# Patient Record
Sex: Female | Born: 1980 | Race: White | Hispanic: No | Marital: Married | Smoking: Never smoker
Health system: Southern US, Community
[De-identification: ages and names within clinical notes are randomized; demographics above are authoritative.]

## PROBLEM LIST (undated history)

## (undated) ENCOUNTER — Inpatient Hospital Stay (HOSPITAL_COMMUNITY): Payer: Self-pay

## (undated) DIAGNOSIS — R87629 Unspecified abnormal cytological findings in specimens from vagina: Secondary | ICD-10-CM

## (undated) DIAGNOSIS — C801 Malignant (primary) neoplasm, unspecified: Secondary | ICD-10-CM

## (undated) DIAGNOSIS — K219 Gastro-esophageal reflux disease without esophagitis: Secondary | ICD-10-CM

## (undated) HISTORY — DX: Malignant (primary) neoplasm, unspecified: C80.1

## (undated) HISTORY — DX: Gastro-esophageal reflux disease without esophagitis: K21.9

## (undated) HISTORY — DX: Unspecified abnormal cytological findings in specimens from vagina: R87.629

---
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
## 2005-12-20 HISTORY — PX: NASAL SEPTUM SURGERY: SHX37

## 2014-12-20 DIAGNOSIS — C439 Malignant melanoma of skin, unspecified: Secondary | ICD-10-CM | POA: Insufficient documentation

## 2015-07-21 HISTORY — PX: LEG SURGERY: SHX1003

## 2015-12-02 ENCOUNTER — Other Ambulatory Visit (INDEPENDENT_AMBULATORY_CARE_PROVIDER_SITE_OTHER): Payer: 59

## 2015-12-02 ENCOUNTER — Ambulatory Visit (INDEPENDENT_AMBULATORY_CARE_PROVIDER_SITE_OTHER): Payer: 59 | Admitting: Internal Medicine

## 2015-12-02 ENCOUNTER — Encounter: Payer: Self-pay | Admitting: Internal Medicine

## 2015-12-02 VITALS — BP 110/70 | HR 56 | Temp 99.0°F | Resp 14 | Ht 65.0 in | Wt 128.1 lb

## 2015-12-02 DIAGNOSIS — Z0000 Encounter for general adult medical examination without abnormal findings: Secondary | ICD-10-CM

## 2015-12-02 DIAGNOSIS — K219 Gastro-esophageal reflux disease without esophagitis: Secondary | ICD-10-CM

## 2015-12-02 DIAGNOSIS — L709 Acne, unspecified: Secondary | ICD-10-CM | POA: Insufficient documentation

## 2015-12-02 DIAGNOSIS — Z85820 Personal history of malignant melanoma of skin: Secondary | ICD-10-CM | POA: Insufficient documentation

## 2015-12-02 DIAGNOSIS — Z9889 Other specified postprocedural states: Secondary | ICD-10-CM

## 2015-12-02 DIAGNOSIS — Z23 Encounter for immunization: Secondary | ICD-10-CM | POA: Diagnosis not present

## 2015-12-02 HISTORY — DX: Acne, unspecified: L70.9

## 2015-12-02 LAB — COMPREHENSIVE METABOLIC PANEL
ALBUMIN: 4.1 g/dL (ref 3.5–5.2)
ALT: 12 U/L (ref 0–35)
AST: 17 U/L (ref 0–37)
Alkaline Phosphatase: 38 U/L — ABNORMAL LOW (ref 39–117)
BUN: 12 mg/dL (ref 6–23)
CALCIUM: 8.5 mg/dL (ref 8.4–10.5)
CHLORIDE: 105 meq/L (ref 96–112)
CO2: 26 meq/L (ref 19–32)
Creatinine, Ser: 0.75 mg/dL (ref 0.40–1.20)
GFR: 94.22 mL/min (ref >60.00)
Glucose, Bld: 101 mg/dL — ABNORMAL HIGH (ref 70–99)
POTASSIUM: 4 meq/L (ref 3.5–5.1)
SODIUM: 138 meq/L (ref 135–145)
Total Bilirubin: 0.5 mg/dL (ref 0.2–1.2)
Total Protein: 6.5 g/dL (ref 6.0–8.3)

## 2015-12-02 LAB — CBC
HEMATOCRIT: 36.3 % (ref 36.0–46.0)
Hemoglobin: 12 g/dL (ref 12.0–15.0)
MCHC: 33 g/dL (ref 30.0–36.0)
MCV: 87.8 fl (ref 78.0–100.0)
Platelets: 216 10*3/uL (ref 150.0–400.0)
RBC: 4.14 Mil/uL (ref 3.87–5.11)
RDW: 12.5 % (ref 11.5–15.5)
WBC: 7.2 10*3/uL (ref 4.0–10.5)

## 2015-12-02 LAB — LIPID PANEL
CHOLESTEROL: 140 mg/dL (ref 0–200)
HDL: 60.6 mg/dL (ref >39.00)
LDL CALC: 73 mg/dL (ref 0–99)
NonHDL: 79.61
TRIGLYCERIDES: 31 mg/dL (ref 0.0–149.0)
Total CHOL/HDL Ratio: 2
VLDL: 6.2 mg/dL (ref 0.0–40.0)

## 2015-12-02 MED ORDER — OMEPRAZOLE 40 MG PO CPDR: 40.0000 mg | DELAYED_RELEASE_CAPSULE | Freq: Every day | ORAL | Status: DC

## 2015-12-02 NOTE — Progress Notes (Signed)
   Subjective:    Patient ID: Suzanne Mclaughlin, female    DOB: March 22, 1981, 34 y.o.   MRN: [id number]  HPI The patient is a new 34 YO female coming in for acid reflux. She has had it in the past and was on omeprazole with good success. She does not have it often. She will only take medicine if she has symptoms several days in a row. She has modified her diet to help with symptoms which helps most of the time. Prior workup with GI in [place].  PMH, [facility], social history reviewed and updated.   Review of Systems  Constitutional: Negative for fever, diaphoresis, activity change, appetite change and fatigue.  HENT: Negative.   Eyes: Negative.   Respiratory: Negative for cough, chest tightness, shortness of breath and wheezing.   Cardiovascular: Negative for chest pain, palpitations and leg swelling.  Gastrointestinal: Negative for nausea, abdominal pain, diarrhea, constipation and abdominal distention.       Mild GERD symptoms  Musculoskeletal: Negative for myalgias, back pain, arthralgias, neck pain and neck stiffness.  Skin: Negative.   Neurological: Negative.   Psychiatric/Behavioral: Negative.       Objective:   Physical Exam  Constitutional: She is oriented to person, place, and time. She appears well-developed and well-nourished.  HENT:  Head: Normocephalic and atraumatic.  Eyes: EOM are normal.  Neck: Normal range of motion.  Cardiovascular: Normal rate and regular rhythm.   Pulmonary/Chest: Effort normal and breath sounds normal. No respiratory distress. She has no wheezes. She has no rales.  Abdominal: Soft. Bowel sounds are normal. She exhibits no distension. There is no tenderness. There is no rebound.  Musculoskeletal: She exhibits no edema.  Neurological: She is alert and oriented to person, place, and time. Coordination normal.  Skin: Skin is warm and dry.  Psychiatric: She has a normal mood and affect.   Filed Vitals:   12/02/15 1457  BP: 110/70  Pulse: 56  Temp: 99 F  (37.2 C)  TempSrc: Oral  Resp: 14  Height: 5\' 5"  (1.651 m)  Weight: 128 lb 1.9 oz (58.115 kg)  SpO2: 99%      Assessment & Plan:  Tdap given at visit.

## 2015-12-02 NOTE — Assessment & Plan Note (Signed)
Rx for omeprazole which she will take prn for symptoms for multiple days in a row.

## 2015-12-02 NOTE — Assessment & Plan Note (Signed)
Currently taking ampicillin for acne and has been on antibiotics for it for several years in a row. Under the care of dermatology.

## 2015-12-02 NOTE — Assessment & Plan Note (Signed)
Sees dermatology every 3 months. Careful with sunscreen and protective clothing.

## 2015-12-02 NOTE — Progress Notes (Signed)
Pre visit review using our clinic review tool, if applicable. No additional management support is needed unless otherwise documented below in the visit note. 

## 2015-12-02 NOTE — Patient Instructions (Signed)
We will check the routine labs today and call you back with the results.   We have given you the tetanus booster shot today. We have sent in the omeprazole.   Good luck and come back in 1-2 years if things are going well. If you have any problems or questions call us sooner.   Health Maintenance, Female Adopting a healthy lifestyle and getting preventive care can go a long way to promote health and wellness. Talk with your health care provider about what schedule of regular examinations is right for you. This is a good chance for you to check in with your provider about disease prevention and staying healthy. In between checkups, there are plenty of things you can do on your own. Experts have done a lot of research about which lifestyle changes and preventive measures are most likely to keep you healthy. Ask your health care provider for more information. WEIGHT AND DIET  Eat a healthy diet  Be sure to include plenty of vegetables, fruits, low-fat dairy products, and lean protein.  Do not eat a lot of foods high in solid fats, added sugars, or salt.  Get regular exercise. This is one of the most important things you can do for your health.  Most adults should exercise for at least 150 minutes each week. The exercise should increase your heart rate and make you sweat (moderate-intensity exercise).  Most adults should also do strengthening exercises at least twice a week. This is in addition to the moderate-intensity exercise.  Maintain a healthy weight  Body mass index (BMI) is a measurement that can be used to identify possible weight problems. It estimates body fat based on height and weight. Your health care provider can help determine your BMI and help you achieve or maintain a healthy weight.  For females 103 years of age and older:   A BMI below 18.5 is considered underweight.  A BMI of 18.5 to 24.9 is normal.  A BMI of 25 to 29.9 is considered overweight.  A BMI of 30 and  above is considered obese.  Watch levels of cholesterol and blood lipids  You should start having your blood tested for lipids and cholesterol at 33 years of age, then have this test every 5 years.  You may need to have your cholesterol levels checked more often if:  Your lipid or cholesterol levels are high.  You are older than 33 years of age.  You are at high risk for heart disease.  CANCER SCREENING   Lung Cancer  Lung cancer screening is recommended for adults 40-15 years old who are at high risk for lung cancer because of a history of smoking.  A yearly low-dose CT scan of the lungs is recommended for people who:  Currently smoke.  Have quit within the past 15 years.  Have at least a 30-pack-year history of smoking. A pack year is smoking an average of one pack of cigarettes a day for 1 year.  Yearly screening should continue until it has been 15 years since you quit.  Yearly screening should stop if you develop a health problem that would prevent you from having lung cancer treatment.  Breast Cancer  Practice breast self-awareness. This means understanding how your breasts normally appear and feel.  It also means doing regular breast self-exams. Let your health care provider know about any changes, no matter how small.  If you are in your 20s or 30s, you should have a clinical breast exam (CBE)  by a health care provider every 1-3 years as part of a regular health exam.  If you are 40 or older, have a CBE every year. Also consider having a breast X-ray (mammogram) every year.  If you have a family history of breast cancer, talk to your health care provider about genetic screening.  If you are at high risk for breast cancer, talk to your health care provider about having an MRI and a mammogram every year.  Breast cancer gene (BRCA) assessment is recommended for women who have family members with BRCA-related cancers. BRCA-related cancers  include:  Breast.  Ovarian.  Tubal.  Peritoneal cancers.  Results of the assessment will determine the need for genetic counseling and BRCA1 and BRCA2 testing. Cervical Cancer Your health care provider may recommend that you be screened regularly for cancer of the pelvic organs (ovaries, uterus, and vagina). This screening involves a pelvic examination, including checking for microscopic changes to the surface of your cervix (Pap test). You may be encouraged to have this screening done every 3 years, beginning at age 21.  For women ages 30-65, health care providers may recommend pelvic exams and Pap testing every 3 years, or they may recommend the Pap and pelvic exam, combined with testing for human papilloma virus (HPV), every 5 years. Some types of HPV increase your risk of cervical cancer. Testing for HPV may also be done on women of any age with unclear Pap test results.  Other health care providers may not recommend any screening for nonpregnant women who are considered low risk for pelvic cancer and who do not have symptoms. Ask your health care provider if a screening pelvic exam is right for you.  If you have had past treatment for cervical cancer or a condition that could lead to cancer, you need Pap tests and screening for cancer for at least 20 years after your treatment. If Pap tests have been discontinued, your risk factors (such as having a new sexual partner) need to be reassessed to determine if screening should resume. Some women have medical problems that increase the chance of getting cervical cancer. In these cases, your health care provider may recommend more frequent screening and Pap tests. Colorectal Cancer  This type of cancer can be detected and often prevented.  Routine colorectal cancer screening usually begins at 33 years of age and continues through 33 years of age.  Your health care provider may recommend screening at an earlier age if you have risk factors for  colon cancer.  Your health care provider may also recommend using home test kits to check for hidden blood in the stool.  A small camera at the end of a tube can be used to examine your colon directly (sigmoidoscopy or colonoscopy). This is done to check for the earliest forms of colorectal cancer.  Routine screening usually begins at age 50.  Direct examination of the colon should be repeated every 5-10 years through 33 years of age. However, you may need to be screened more often if early forms of precancerous polyps or small growths are found. Skin Cancer  Check your skin from head to toe regularly.  Tell your health care provider about any new moles or changes in moles, especially if there is a change in a mole's shape or color.  Also tell your health care provider if you have a mole that is larger than the size of a pencil eraser.  Always use sunscreen. Apply sunscreen liberally and repeatedly throughout the   day.  Protect yourself by wearing long sleeves, pants, a wide-brimmed hat, and sunglasses whenever you are outside. HEART DISEASE, DIABETES, AND HIGH BLOOD PRESSURE   High blood pressure causes heart disease and increases the risk of stroke. High blood pressure is more likely to develop in:  People who have blood pressure in the high end of the normal range (130-139/85-89 mm Hg).  People who are overweight or obese.  People who are African American.  If you are 18-39 years of age, have your blood pressure checked every 3-5 years. If you are 40 years of age or older, have your blood pressure checked every year. You should have your blood pressure measured twice--once when you are at a hospital or clinic, and once when you are not at a hospital or clinic. Record the average of the two measurements. To check your blood pressure when you are not at a hospital or clinic, you can use:  An automated blood pressure machine at a pharmacy.  A home blood pressure monitor.  If you  are between 55 years and 79 years old, ask your health care provider if you should take aspirin to prevent strokes.  Have regular diabetes screenings. This involves taking a blood sample to check your fasting blood sugar level.  If you are at a normal weight and have a low risk for diabetes, have this test once every three years after 33 years of age.  If you are overweight and have a high risk for diabetes, consider being tested at a younger age or more often. PREVENTING INFECTION  Hepatitis B  If you have a higher risk for hepatitis B, you should be screened for this virus. You are considered at high risk for hepatitis B if:  You were born in a country where hepatitis B is common. Ask your health care provider which countries are considered high risk.  Your parents were born in a high-risk country, and you have not been immunized against hepatitis B (hepatitis B vaccine).  You have HIV or AIDS.  You use needles to inject street drugs.  You live with someone who has hepatitis B.  You have had sex with someone who has hepatitis B.  You get hemodialysis treatment.  You take certain medicines for conditions, including cancer, organ transplantation, and autoimmune conditions. Hepatitis C  Blood testing is recommended for:  Everyone born from 1945 through 1965.  Anyone with known risk factors for hepatitis C. Sexually transmitted infections (STIs)  You should be screened for sexually transmitted infections (STIs) including gonorrhea and chlamydia if:  You are sexually active and are younger than 33 years of age.  You are older than 33 years of age and your health care provider tells you that you are at risk for this type of infection.  Your sexual activity has changed since you were last screened and you are at an increased risk for chlamydia or gonorrhea. Ask your health care provider if you are at risk.  If you do not have HIV, but are at risk, it may be recommended that you  take a prescription medicine daily to prevent HIV infection. This is called pre-exposure prophylaxis (PrEP). You are considered at risk if:  You are sexually active and do not regularly use condoms or know the HIV status of your partner(s).  You take drugs by injection.  You are sexually active with a partner who has HIV. Talk with your health care provider about whether you are at high risk of   being infected with HIV. If you choose to begin PrEP, you should first be tested for HIV. You should then be tested every 3 months for as long as you are taking PrEP.  PREGNANCY   If you are premenopausal and you may become pregnant, ask your health care provider about preconception counseling.  If you may become pregnant, take 400 to 800 micrograms (mcg) of folic acid every day.  If you want to prevent pregnancy, talk to your health care provider about birth control (contraception). OSTEOPOROSIS AND MENOPAUSE   Osteoporosis is a disease in which the bones lose minerals and strength with aging. This can result in serious bone fractures. Your risk for osteoporosis can be identified using a bone density scan.  If you are 65 years of age or older, or if you are at risk for osteoporosis and fractures, ask your health care provider if you should be screened.  Ask your health care provider whether you should take a calcium or vitamin D supplement to lower your risk for osteoporosis.  Menopause may have certain physical symptoms and risks.  Hormone replacement therapy may reduce some of these symptoms and risks. Talk to your health care provider about whether hormone replacement therapy is right for you.  HOME CARE INSTRUCTIONS   Schedule regular health, dental, and eye exams.  Stay current with your immunizations.   Do not use any tobacco products including cigarettes, chewing tobacco, or electronic cigarettes.  If you are pregnant, do not drink alcohol.  If you are breastfeeding, limit how  much and how often you drink alcohol.  Limit alcohol intake to no more than 1 drink per day for nonpregnant women. One drink equals 12 ounces of beer, 5 ounces of wine, or 1 ounces of hard liquor.  Do not use street drugs.  Do not share needles.  Ask your health care provider for help if you need support or information about quitting drugs.  Tell your health care provider if you often feel depressed.  Tell your health care provider if you have ever been abused or do not feel safe at home.   This information is not intended to replace advice given to you by your health care provider. Make sure you discuss any questions you have with your health care provider.   Document Released: 06/21/2011 Document Revised: 12/27/2014 Document Reviewed: 11/07/2013 Elsevier Interactive Patient Education 2016 [employer] Inc[employer]  

## 2015-12-21 NOTE — L&D Delivery Note (Signed)
Delivery Note At 12:14 PM Mclaughlin viable and healthy female was delivered via Vaginal, Spontaneous Delivery (Presentation: vtx;  ROA).  APGAR: 9, 9; weight 7 lb 12 oz (3515 g).   Placenta status: spontaneous, intact , .  Cord:  with the following complications: none.  Cord pH: none  Anesthesia:  epidural Episiotomy: None Lacerations: left Labial minora ;Vaginal; left Sulcus Suture Repair: 3.0 chromic Est. Blood Loss (mL): 350  Mom to postpartum.  Baby to Couplet care / Skin to Skin.  Suzanne Mclaughlin 12/04/2016, 3:14 AM

## 2016-05-06 ENCOUNTER — Encounter (HOSPITAL_COMMUNITY): Payer: Self-pay | Admitting: *Deleted

## 2016-05-06 ENCOUNTER — Inpatient Hospital Stay (HOSPITAL_COMMUNITY)
Admission: AD | Admit: 2016-05-06 | Discharge: 2016-05-06 | Disposition: A | Discharge status: Home / Self Care | Payer: Managed Care, Other (non HMO) | Source: Ambulatory Visit | Attending: Obstetrics and Gynecology | Admitting: Obstetrics and Gynecology

## 2016-05-06 DIAGNOSIS — T426X5A Adverse effect of other antiepileptic and sedative-hypnotic drugs, initial encounter: Secondary | ICD-10-CM | POA: Diagnosis not present

## 2016-05-06 DIAGNOSIS — Z85820 Personal history of malignant melanoma of skin: Secondary | ICD-10-CM | POA: Insufficient documentation

## 2016-05-06 DIAGNOSIS — T7840XA Allergy, unspecified, initial encounter: Secondary | ICD-10-CM

## 2016-05-06 DIAGNOSIS — Z3A10 10 weeks gestation of pregnancy: Secondary | ICD-10-CM | POA: Diagnosis not present

## 2016-05-06 DIAGNOSIS — K219 Gastro-esophageal reflux disease without esophagitis: Secondary | ICD-10-CM | POA: Diagnosis not present

## 2016-05-06 DIAGNOSIS — O26891 Other specified pregnancy related conditions, first trimester: Secondary | ICD-10-CM | POA: Diagnosis not present

## 2016-05-06 DIAGNOSIS — O99611 Diseases of the digestive system complicating pregnancy, first trimester: Secondary | ICD-10-CM | POA: Diagnosis not present

## 2016-05-06 DIAGNOSIS — R0602 Shortness of breath: Secondary | ICD-10-CM | POA: Diagnosis present

## 2016-05-06 NOTE — Discharge Instructions (Signed)
Keep hydrated

## 2016-05-06 NOTE — MAU Provider Note (Signed)
History     No chief complaint on file. cc: SOB HPI; 35 yo G1P0 MWF @ [redacted] weeks gestation presented by EMs due to SOB and O2 sat at home of 74% noted ~ 1/2 hr after ingesting phenergan. Pt had been having n/v for past three weeks but it became progressively worse today and called office for medication. Pt had SOB and chest palpitation. Denies CP. No prior hx of taking med or cardiac problem. Husband is a former EMT and took O2 sat. Pt denies leg cramps. On arrival here 100% sat and sx resolved  OB History    Gravida Para Term Preterm AB TAB SAB Ectopic Multiple Living   1               Past Medical History  Diagnosis Date  . Cancer ([place])     melanoma  . GERD (gastroesophageal reflux disease)     Past Surgical History  Procedure Laterality Date  . Tonsillectomy and adenoidectomy  1997  . Leg surgery Right 07/2015    1a melanoma removal from lower leg  . Nasal septum surgery  2007    Family History  Problem Relation Age of Onset  . Hyperlipidemia Mother   . Hypertension Mother   . Cancer Maternal Aunt     Social History  Substance Use Topics  . Smoking status: Never Smoker   . Smokeless tobacco: None  . Alcohol Use: No    Allergies: No Known Allergies  Prescriptions prior to admission  Medication Sig Dispense Refill Last Dose  . ampicillin (PRINCIPEN) 500 MG capsule Take 500 mg by mouth.   05/05/2016 at Unknown time  . folic acid (FOLVITE) [id number] MCG tablet Take 0.8 mg by mouth.   05/05/2016 at Unknown time  . Prenatal Vit-Fe Fumarate-FA (PRENATAL MULTIVITAMIN) TABS tablet Take 1 tablet by mouth daily at 12 noon.   05/05/2016 at Unknown time  . promethazine (PHENERGAN) 25 MG tablet Take 25 mg by mouth every 6 (six) hours as needed for nausea or vomiting.   05/06/2016 at 1810  . ranitidine (ZANTAC) 150 MG tablet Take 150 mg by mouth 2 (two) times daily.   05/06/2016 at Unknown time  . omeprazole (PRILOSEC) 40 MG capsule Take 1 capsule (40 mg total) by mouth daily. 90 capsule 3       Physical Exam   Blood pressure 117/67, pulse 81, temperature 98.2 F (36.8 C), temperature source Oral, resp. rate 18, height 5\' 5"  (1.651 m), weight 57.607 kg (127 lb), last menstrual period 11/03/2015, SpO2 100 %.  General appearance: alert, cooperative and no distress Lungs: clear to auscultation bilaterally Heart: regular rate and rhythm, S1, S2 normal, no murmur, click, rub or gallop Abdomen: soft, non-tender; bowel sounds normal; no masses,  no organomegaly  Bedside sonogram (+) FHR  Extremity: no calf tenderness or swelling  ED Course  IMP: Allergic rxn to phenergan IUP @ 10 weeks P) d/c phenergan. 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 Ready to go home. Declines zofran Reviewed poss diet intake with HEG Keep sched OB appts  MDM  Teneisha Gignac A, MD 9:01 PM 05/06/2016

## 2016-05-06 NOTE — MAU Note (Signed)
Urine collected and sent to lab.

## 2016-05-06 NOTE — MAU Note (Signed)
Pt reports [redacted] weeks pregnant , seen yesterday. Has had nausea and vomiting off/on but today vomiting constantly. Called office and was RX phenergan and right after she started having SOB .

## 2016-05-14 LAB — OB RESULTS CONSOLE HIV ANTIBODY (ROUTINE TESTING): HIV: NONREACTIVE

## 2016-05-14 LAB — OB RESULTS CONSOLE ANTIBODY SCREEN: ANTIBODY SCREEN: NEGATIVE

## 2016-05-14 LAB — OB RESULTS CONSOLE RPR: RPR: NONREACTIVE

## 2016-05-14 LAB — OB RESULTS CONSOLE ABO/RH: RH Type: POSITIVE

## 2016-05-14 LAB — OB RESULTS CONSOLE RUBELLA ANTIBODY, IGM: RUBELLA: IMMUNE

## 2016-05-14 LAB — OB RESULTS CONSOLE GC/CHLAMYDIA
CHLAMYDIA, DNA PROBE: NEGATIVE
GC PROBE AMP, GENITAL: NEGATIVE

## 2016-05-14 LAB — OB RESULTS CONSOLE HEPATITIS B SURFACE ANTIGEN: Hepatitis B Surface Ag: NEGATIVE

## 2016-08-11 ENCOUNTER — Other Ambulatory Visit (HOSPITAL_COMMUNITY): Payer: Self-pay | Admitting: Obstetrics and Gynecology

## 2016-08-11 DIAGNOSIS — Z3689 Encounter for other specified antenatal screening: Secondary | ICD-10-CM

## 2016-08-11 DIAGNOSIS — Z3A27 27 weeks gestation of pregnancy: Secondary | ICD-10-CM

## 2016-08-11 DIAGNOSIS — IMO0002 Reserved for concepts with insufficient information to code with codable children: Secondary | ICD-10-CM

## 2016-08-24 ENCOUNTER — Encounter (HOSPITAL_COMMUNITY): Payer: Self-pay | Admitting: Obstetrics and Gynecology

## 2016-08-31 ENCOUNTER — Ambulatory Visit (HOSPITAL_COMMUNITY)
Admission: RE | Admit: 2016-08-31 | Discharge: 2016-08-31 | Disposition: A | Discharge status: Home / Self Care | Payer: Managed Care, Other (non HMO) | Source: Ambulatory Visit | Attending: Obstetrics and Gynecology | Admitting: Obstetrics and Gynecology

## 2016-08-31 ENCOUNTER — Encounter (HOSPITAL_COMMUNITY): Payer: Self-pay

## 2016-08-31 DIAGNOSIS — Z3A27 27 weeks gestation of pregnancy: Secondary | ICD-10-CM | POA: Insufficient documentation

## 2016-08-31 DIAGNOSIS — O358XX0 Maternal care for other (suspected) fetal abnormality and damage, not applicable or unspecified: Secondary | ICD-10-CM | POA: Insufficient documentation

## 2016-08-31 DIAGNOSIS — Z3689 Encounter for other specified antenatal screening: Secondary | ICD-10-CM

## 2016-08-31 DIAGNOSIS — IMO0002 Reserved for concepts with insufficient information to code with codable children: Secondary | ICD-10-CM

## 2016-08-31 DIAGNOSIS — Z36 Encounter for antenatal screening of mother: Secondary | ICD-10-CM | POA: Diagnosis not present

## 2016-08-31 IMAGING — US US MFM OB DETAIL+14 WK
1 series · 13 of 28 positions shown · non-contrast
Comparison: none

[Series 1: us mfm ob detail+14 wk · 105 acquisitions, 13 frames shown]
[im 4/105]
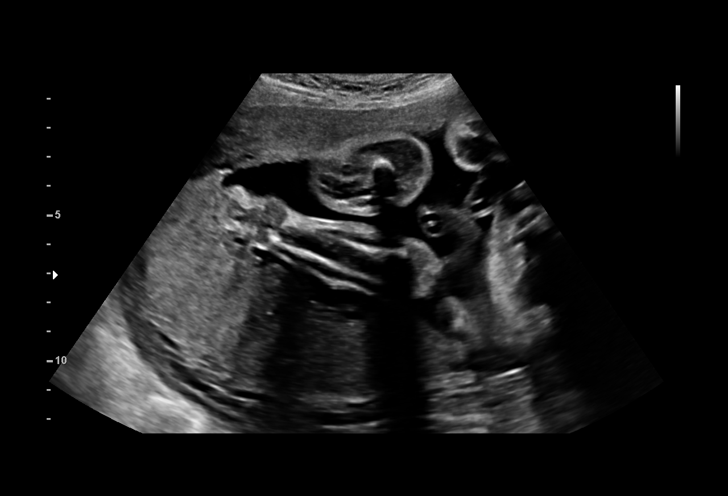
[im 12/105]
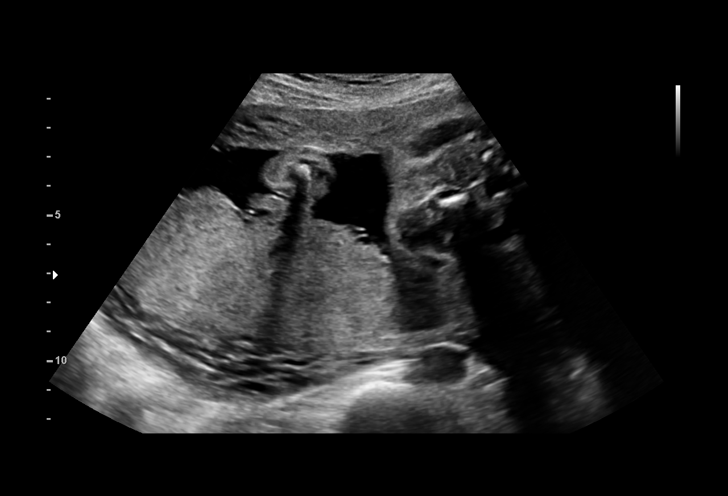
[im 20/105]
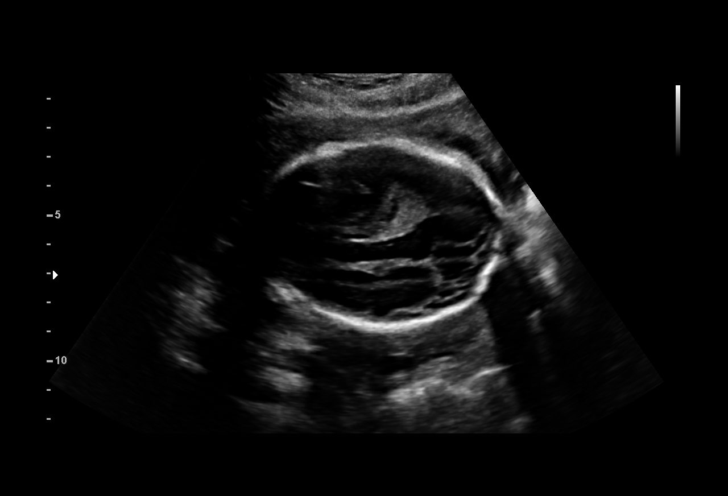
[im 27/105]
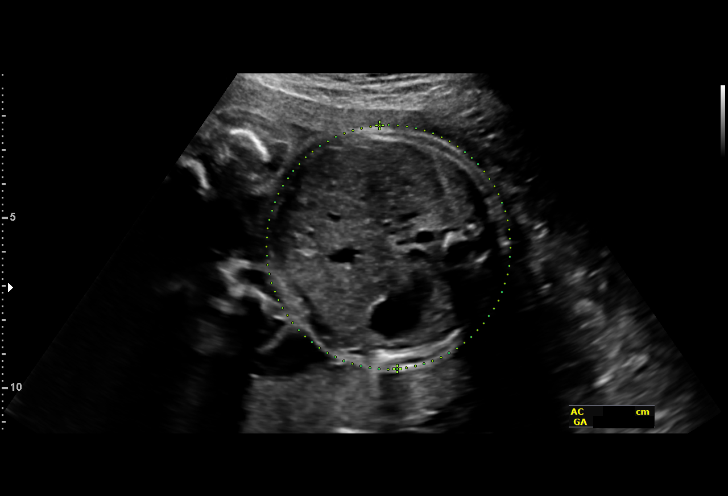
[im 35/105]
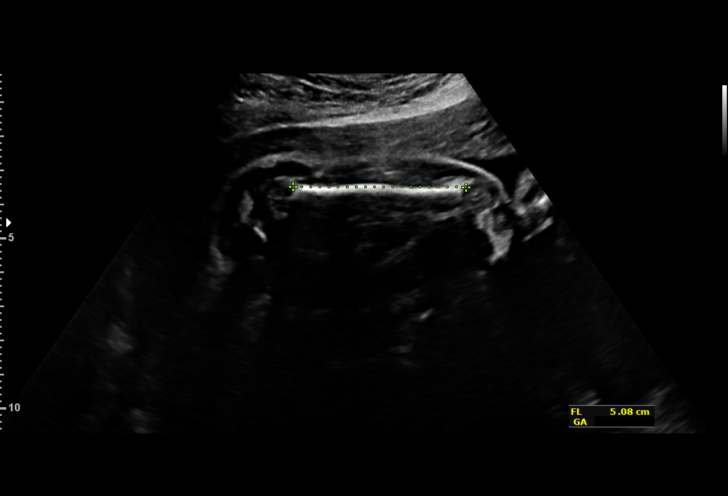
[im 43/105]
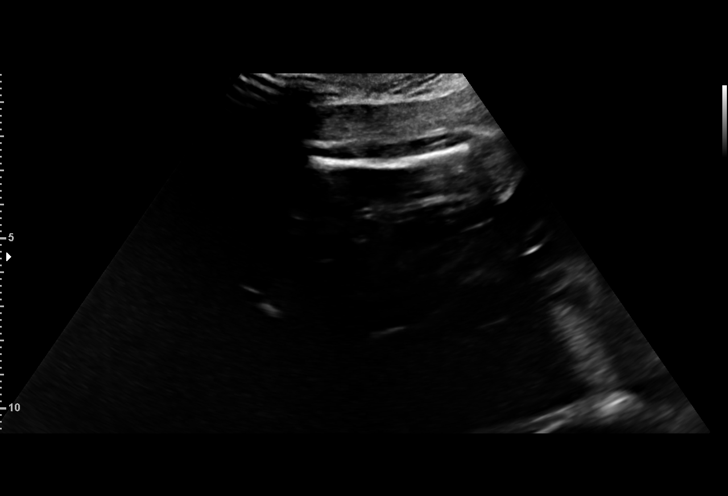
[im 54/105]
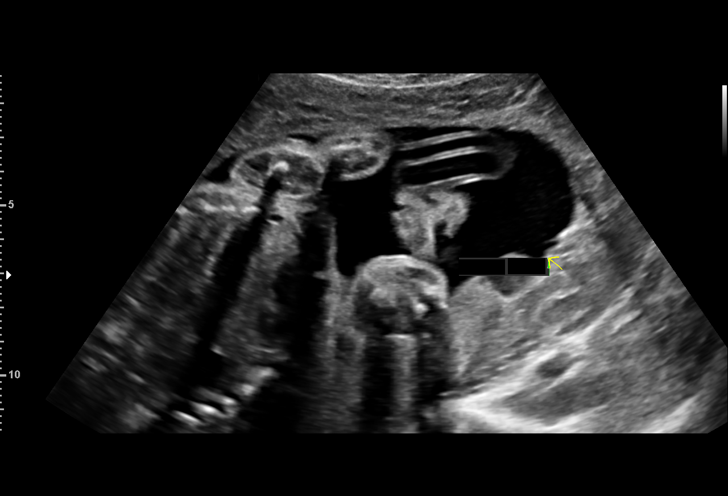
[im 62/105]
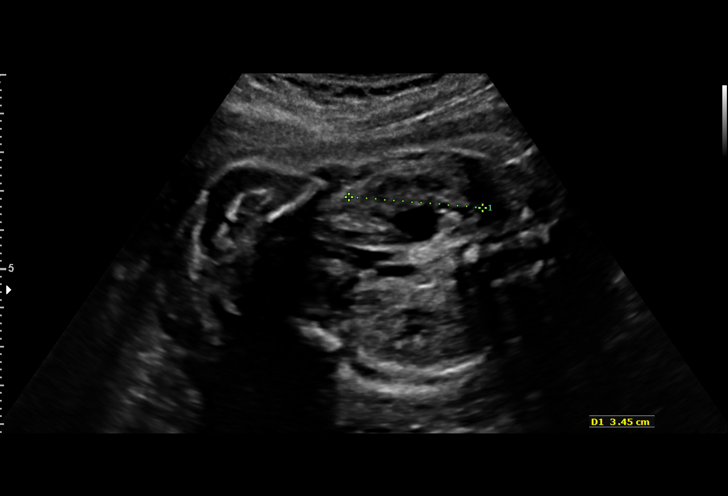
[im 70/105]
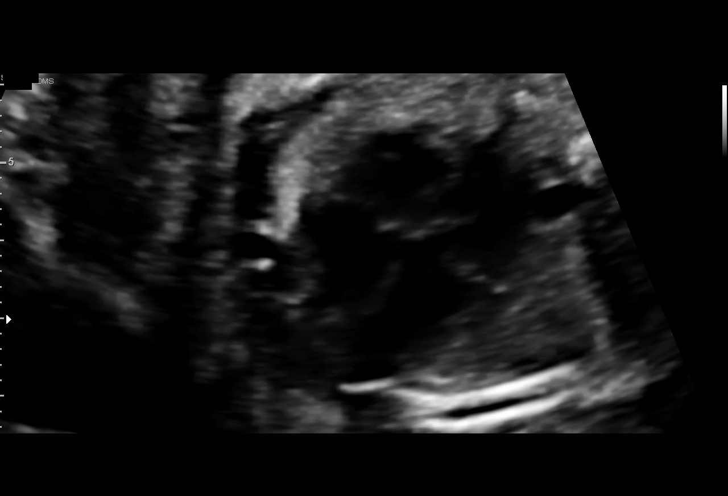
[im 78/105]
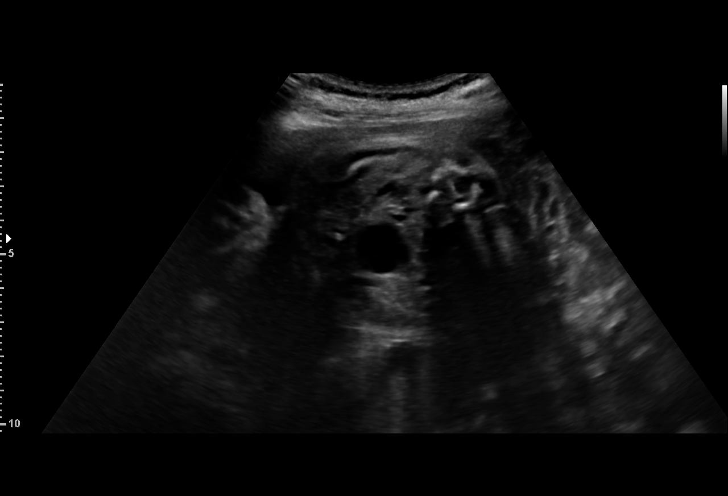
[im 85/105]
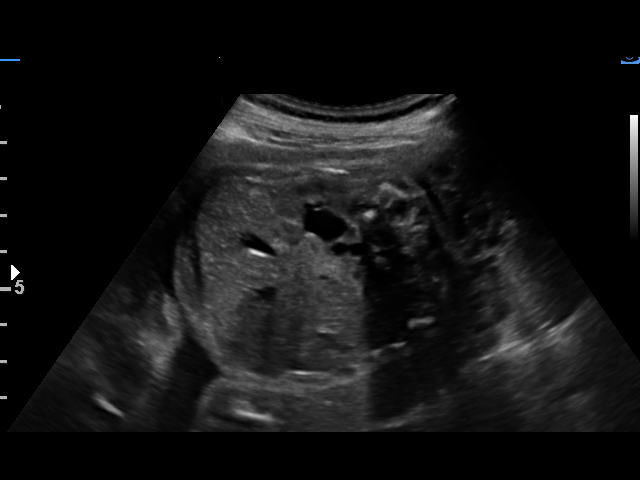
[im 93/105]
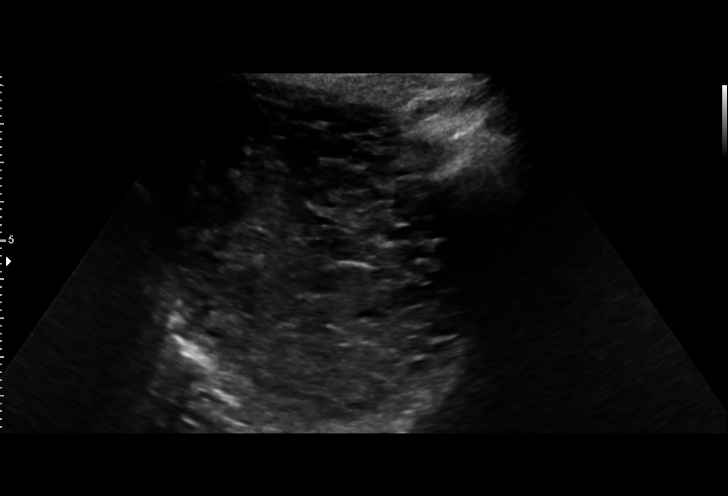
[im 101/105]
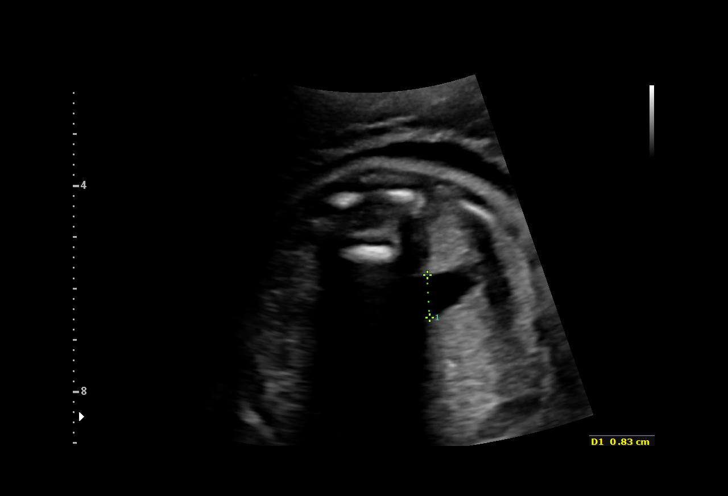

[13 of 28 positions shown; findings below may reference images not displayed]

[facility]

1  ABD HALIM               [PHONE_NUMBER]      [PHONE_NUMBER]     [PHONE_NUMBER]
ABD HALIM
Indications

26 weeks gestation of pregnancy
Fetal abnormality - other known or             [CG]
suspected (renal pyelectasis)
Detailed fetal anatomic survey                 Z36
OB History

Gravidity:    1
Fetal Evaluation

Num Of Fetuses:     1
Fetal Heart         175
Rate(bpm):
Cardiac Activity:   Observed
Presentation:       Cephalic
Placenta:           Posterior, above cervical os
P. Cord Insertion:  Visualized, central

Amniotic Fluid
AFI FV:      Subjectively within normal limits

Largest Pocket(cm)
6
Biometry

BPD:      70.5  mm     G. Age:  28w 2d         90  %    CI:        72.76   %   70 - 86
FL/HC:      19.3   %   18.6 -
HC:      262.8  mm     G. Age:  28w 4d         85  %    HC/AC:      1.18       1.05 -
AC:      223.5  mm     G. Age:  26w 5d         48  %    FL/BPD:     71.8   %   71 - 87
FL:       50.6  mm     G. Age:  27w 1d         53  %    FL/AC:      22.6   %   20 - 24
HUM:      45.6  mm     G. Age:  27w 0d         55  %
CER:      30.5  mm     G. Age:  26w 5d         55  %

CM:        9.2  mm
Est. FW:    [CG]  gm      2 lb 5 oz     66  %
Gestational Age

Clinical EDD:  26w 4d                                        EDD:   [DATE]
U/S Today:     27w 5d                                        EDD:   [DATE]
Best:          26w 4d    Det. By:   Clinical EDD             EDD:   [DATE]
Anatomy

Cranium:               Appears normal         LVOT:                   Appears normal
Cavum:                 Appears normal         Aortic Arch:            Not well visualized
Ventricles:            Appears normal         Ductal Arch:            Not well visualized
Choroid Plexus:        Appears normal         Diaphragm:              Appears normal
Cerebellum:            Appears normal         Stomach:                Appears normal, left
sided
Posterior Fossa:       Appears normal         Abdomen:                Appears normal
Nuchal Fold:           Appears normal         Abdominal Wall:         Appears nml (cord
insert, abd wall)
Face:                  Appears normal         Cord Vessels:           Appears normal (3
(orbits and profile)                           vessel cord)
Lips:                  Appears normal         Kidneys:                Bilateral UTD R-
1.1cm Lt .8cm
Palate:                Appears normal         Bladder:                Appears normal
Thoracic:              Appears normal         Spine:                  Appears normal
Heart:                 Appears normal         Upper Extremities:      Appears normal
(4CH, axis, and
situs)
RVOT:                  Appears normal         Lower Extremities:      Appears normal

Other:  Heels visualized. Nasal bone visualized. Male gender.
Cervix Uterus Adnexa

Cervix
Length:            4.2  cm.
Normal appearance by transabdominal scan.

Left Ovary
Not visualized. No adnexal mass visualized.

Right Ovary
Not visualized. No adnexal mass visualized. 6
Impression

SIUP at 26+4 weeks
Bilateral UTD A2-3; right renal pelvis measured 11 mms with
central calyceal dilation; left renal pelvis measured 8 mms;
normal parenchyma; no ureters visualized; bladder appeared
normal
All other detailed fetal anatomy was seen and appeared
normal; limited views arches
Normal amniotic fluid volume
Measurements consistent with stated EDC; EFW at the 66th
%tile

The US findings were shared with Dr. ABD HALIM and her husband.
The implications of dilated renal pelves were discussed in
detail. Dilation could be secondary to the progesterone effect
on ureters, UV reflux or partial UPJ obstruction. Prognosis is
very good.

They were offered prenatal consultation with a pediatric
urologist - will consider.
Recommendations

Serial ABD HALIM to reassess kidneys
If pelves are > 7 at delivery, notify pediatrician for possible
postnatal ultrasound, antibiotics and/or outpt referral to peds
urologist

We would be happy to help follow -just let us know.

## 2016-09-02 ENCOUNTER — Other Ambulatory Visit (HOSPITAL_COMMUNITY): Payer: Self-pay

## 2016-09-02 ENCOUNTER — Encounter (HOSPITAL_COMMUNITY): Payer: Self-pay

## 2016-11-09 LAB — OB RESULTS CONSOLE GBS: STREP GROUP B AG: NEGATIVE

## 2016-11-15 ENCOUNTER — Other Ambulatory Visit: Payer: Self-pay | Admitting: Obstetrics and Gynecology

## 2016-11-24 ENCOUNTER — Other Ambulatory Visit: Payer: Self-pay | Admitting: Obstetrics and Gynecology

## 2016-11-30 ENCOUNTER — Encounter (HOSPITAL_COMMUNITY): Payer: Self-pay | Admitting: *Deleted

## 2016-11-30 ENCOUNTER — Telehealth (HOSPITAL_COMMUNITY): Payer: Self-pay | Admitting: *Deleted

## 2016-11-30 NOTE — Telephone Encounter (Signed)
Preadmission screen  

## 2016-12-02 ENCOUNTER — Inpatient Hospital Stay (HOSPITAL_COMMUNITY)
Admission: AD | Admit: 2016-12-02 | Discharge: 2016-12-05 | DRG: 775 | Disposition: A | Discharge status: Home / Self Care | Payer: Managed Care, Other (non HMO) | Source: Ambulatory Visit | Attending: Obstetrics and Gynecology | Admitting: Obstetrics and Gynecology

## 2016-12-02 ENCOUNTER — Encounter (HOSPITAL_COMMUNITY): Payer: Self-pay | Admitting: *Deleted

## 2016-12-02 DIAGNOSIS — Z3A40 40 weeks gestation of pregnancy: Secondary | ICD-10-CM | POA: Diagnosis not present

## 2016-12-02 DIAGNOSIS — O4202 Full-term premature rupture of membranes, onset of labor within 24 hours of rupture: Principal | ICD-10-CM | POA: Diagnosis present

## 2016-12-02 DIAGNOSIS — O9081 Anemia of the puerperium: Secondary | ICD-10-CM | POA: Diagnosis not present

## 2016-12-02 DIAGNOSIS — O9962 Diseases of the digestive system complicating childbirth: Secondary | ICD-10-CM | POA: Diagnosis present

## 2016-12-02 DIAGNOSIS — K219 Gastro-esophageal reflux disease without esophagitis: Secondary | ICD-10-CM | POA: Diagnosis present

## 2016-12-02 DIAGNOSIS — D62 Acute posthemorrhagic anemia: Secondary | ICD-10-CM | POA: Diagnosis not present

## 2016-12-02 DIAGNOSIS — O2243 Hemorrhoids in pregnancy, third trimester: Secondary | ICD-10-CM | POA: Diagnosis present

## 2016-12-02 MED ORDER — OXYCODONE-ACETAMINOPHEN 5-325 MG PO TABS: 1.0000 | ORAL_TABLET | ORAL | Status: DC | PRN

## 2016-12-02 MED ORDER — OXYTOCIN 40 UNITS IN LACTATED RINGERS INFUSION - SIMPLE MED
2.5000 [IU]/h | INTRAVENOUS | Status: DC
  Filled 2016-12-02: qty 1000

## 2016-12-02 MED ORDER — LIDOCAINE HCL (PF) 1 % IJ SOLN
30.0000 mL | INTRAMUSCULAR | Status: DC | PRN
  Filled 2016-12-02: qty 30

## 2016-12-02 MED ORDER — ACETAMINOPHEN 325 MG PO TABS: 650.0000 mg | ORAL_TABLET | ORAL | Status: DC | PRN

## 2016-12-02 MED ORDER — OXYCODONE-ACETAMINOPHEN 5-325 MG PO TABS: 2.0000 | ORAL_TABLET | ORAL | Status: DC | PRN

## 2016-12-02 MED ORDER — LACTATED RINGERS IV SOLN
INTRAVENOUS | Status: DC
  Administered 2016-12-02 – 2016-12-03 (×3): via INTRAVENOUS

## 2016-12-02 MED ORDER — SOD CITRATE-CITRIC ACID 500-334 MG/5ML PO SOLN
30.0000 mL | ORAL | Status: DC | PRN
  Administered 2016-12-03: 30 mL via ORAL
  Filled 2016-12-02: qty 15

## 2016-12-02 MED ORDER — FLEET ENEMA 7-19 GM/118ML RE ENEM: 1.0000 | ENEMA | RECTAL | Status: DC | PRN

## 2016-12-02 MED ORDER — ONDANSETRON HCL 4 MG/2ML IJ SOLN: 4.0000 mg | Freq: Four times a day (QID) | INTRAMUSCULAR | Status: DC | PRN

## 2016-12-02 MED ORDER — OXYTOCIN BOLUS FROM INFUSION
500.0000 mL | Freq: Once | INTRAVENOUS | Status: AC
  Administered 2016-12-03: 500 mL via INTRAVENOUS

## 2016-12-02 MED ORDER — LACTATED RINGERS IV SOLN
500.0000 mL | INTRAVENOUS | Status: DC | PRN
  Administered 2016-12-03: 1000 mL via INTRAVENOUS

## 2016-12-02 NOTE — MAU Note (Signed)
Pt presents to MAU with complaints of ROM at 0920. Pt reports irregular contractions. Good fetal movement.

## 2016-12-02 NOTE — MAU Note (Signed)
Patient presents with PROM at 2100. Patient denies any ctx. No problems during the pregnancy. Fetus active

## 2016-12-03 ENCOUNTER — Inpatient Hospital Stay (HOSPITAL_COMMUNITY): Payer: Managed Care, Other (non HMO) | Admitting: Anesthesiology

## 2016-12-03 ENCOUNTER — Encounter (HOSPITAL_COMMUNITY): Payer: Self-pay

## 2016-12-03 LAB — RPR: RPR Ser Ql: NONREACTIVE

## 2016-12-03 LAB — ABO/RH: ABO/RH(D): AB POS

## 2016-12-03 LAB — TYPE AND SCREEN
ABO/RH(D): AB POS
Antibody Screen: NEGATIVE

## 2016-12-03 LAB — CBC
HEMATOCRIT: 36.1 % (ref 36.0–46.0)
HEMOGLOBIN: 12.5 g/dL (ref 12.0–15.0)
MCH: 30.2 pg (ref 26.0–34.0)
MCHC: 34.6 g/dL (ref 30.0–36.0)
MCV: 87.2 fL (ref 78.0–100.0)
Platelets: 160 10*3/uL (ref 150–400)
RBC: 4.14 MIL/uL (ref 3.87–5.11)
RDW: 14.8 % (ref 11.5–15.5)
WBC: 9.4 10*3/uL (ref 4.0–10.5)

## 2016-12-03 MED ORDER — BENZOCAINE-MENTHOL 20-0.5 % EX AERO
1.0000 "application " | INHALATION_SPRAY | CUTANEOUS | Status: DC | PRN
  Administered 2016-12-03: 1 via TOPICAL
  Filled 2016-12-03: qty 56

## 2016-12-03 MED ORDER — PHENYLEPHRINE 40 MCG/ML (10ML) SYRINGE FOR IV PUSH (FOR BLOOD PRESSURE SUPPORT): 80.0000 ug | PREFILLED_SYRINGE | INTRAVENOUS | Status: DC | PRN

## 2016-12-03 MED ORDER — ONDANSETRON HCL 4 MG PO TABS: 4.0000 mg | ORAL_TABLET | ORAL | Status: DC | PRN

## 2016-12-03 MED ORDER — OXYTOCIN 40 UNITS IN LACTATED RINGERS INFUSION - SIMPLE MED: 1.0000 m[IU]/min | INTRAVENOUS | Status: DC

## 2016-12-03 MED ORDER — FENTANYL 2.5 MCG/ML BUPIVACAINE 1/10 % EPIDURAL INFUSION (WH - ANES)
INTRAMUSCULAR | Status: AC
  Filled 2016-12-03: qty 100

## 2016-12-03 MED ORDER — ZOLPIDEM TARTRATE 5 MG PO TABS: 5.0000 mg | ORAL_TABLET | Freq: Every evening | ORAL | Status: DC | PRN

## 2016-12-03 MED ORDER — FERROUS SULFATE 325 (65 FE) MG PO TABS
325.0000 mg | ORAL_TABLET | Freq: Two times a day (BID) | ORAL | Status: DC
  Administered 2016-12-03 – 2016-12-04 (×2): 325 mg via ORAL
  Filled 2016-12-03 (×2): qty 1

## 2016-12-03 MED ORDER — TERBUTALINE SULFATE 1 MG/ML IJ SOLN
0.2500 mg | Freq: Once | INTRAMUSCULAR | Status: DC | PRN
  Filled 2016-12-03: qty 1

## 2016-12-03 MED ORDER — EPHEDRINE 5 MG/ML INJ: 10.0000 mg | INTRAVENOUS | Status: DC | PRN

## 2016-12-03 MED ORDER — SENNOSIDES-DOCUSATE SODIUM 8.6-50 MG PO TABS
2.0000 | ORAL_TABLET | ORAL | Status: DC
  Administered 2016-12-03 – 2016-12-04 (×2): 2 via ORAL
  Filled 2016-12-03 (×2): qty 2

## 2016-12-03 MED ORDER — PRENATAL MULTIVITAMIN CH
1.0000 | ORAL_TABLET | Freq: Every day | ORAL | Status: DC
  Administered 2016-12-03 – 2016-12-05 (×3): 1 via ORAL
  Filled 2016-12-03 (×3): qty 1

## 2016-12-03 MED ORDER — BUTORPHANOL TARTRATE 1 MG/ML IJ SOLN
1.0000 mg | INTRAMUSCULAR | Status: DC | PRN
  Administered 2016-12-03: 1 mg via INTRAVENOUS

## 2016-12-03 MED ORDER — SIMETHICONE 80 MG PO CHEW: 80.0000 mg | CHEWABLE_TABLET | ORAL | Status: DC | PRN

## 2016-12-03 MED ORDER — DIPHENHYDRAMINE HCL 50 MG/ML IJ SOLN: 12.5000 mg | INTRAMUSCULAR | Status: DC | PRN

## 2016-12-03 MED ORDER — DIPHENHYDRAMINE HCL 25 MG PO CAPS: 25.0000 mg | ORAL_CAPSULE | Freq: Four times a day (QID) | ORAL | Status: DC | PRN

## 2016-12-03 MED ORDER — COCONUT OIL OIL
1.0000 "application " | TOPICAL_OIL | Status: DC | PRN
  Administered 2016-12-03: 1 via TOPICAL
  Filled 2016-12-03: qty 120

## 2016-12-03 MED ORDER — DIBUCAINE 1 % RE OINT
1.0000 "application " | TOPICAL_OINTMENT | RECTAL | Status: DC | PRN
  Administered 2016-12-03 – 2016-12-04 (×2): 1 via RECTAL
  Filled 2016-12-03 (×2): qty 28

## 2016-12-03 MED ORDER — BUTORPHANOL TARTRATE 1 MG/ML IJ SOLN
INTRAMUSCULAR | Status: AC
  Filled 2016-12-03: qty 1

## 2016-12-03 MED ORDER — PHENYLEPHRINE 40 MCG/ML (10ML) SYRINGE FOR IV PUSH (FOR BLOOD PRESSURE SUPPORT)
PREFILLED_SYRINGE | INTRAVENOUS | Status: AC
  Filled 2016-12-03: qty 20

## 2016-12-03 MED ORDER — IBUPROFEN 600 MG PO TABS
600.0000 mg | ORAL_TABLET | Freq: Four times a day (QID) | ORAL | Status: DC
  Administered 2016-12-03 – 2016-12-05 (×8): 600 mg via ORAL
  Filled 2016-12-03 (×8): qty 1

## 2016-12-03 MED ORDER — ACETAMINOPHEN 325 MG PO TABS: 650.0000 mg | ORAL_TABLET | ORAL | Status: DC | PRN

## 2016-12-03 MED ORDER — LIDOCAINE HCL (PF) 1 % IJ SOLN
INTRAMUSCULAR | Status: DC | PRN
  Administered 2016-12-03 (×2): 5 mL

## 2016-12-03 MED ORDER — MENTHOL 3 MG MT LOZG
1.0000 | LOZENGE | OROMUCOSAL | Status: DC | PRN
  Administered 2016-12-03: 3 mg via ORAL
  Filled 2016-12-03: qty 9

## 2016-12-03 MED ORDER — LACTATED RINGERS IV SOLN: 500.0000 mL | Freq: Once | INTRAVENOUS | Status: DC

## 2016-12-03 MED ORDER — WITCH HAZEL-GLYCERIN EX PADS
1.0000 "application " | MEDICATED_PAD | CUTANEOUS | Status: DC | PRN
  Administered 2016-12-03 – 2016-12-04 (×2): 1 via TOPICAL

## 2016-12-03 MED ORDER — FENTANYL 2.5 MCG/ML BUPIVACAINE 1/10 % EPIDURAL INFUSION (WH - ANES)
14.0000 mL/h | INTRAMUSCULAR | Status: DC | PRN
  Administered 2016-12-03 (×2): 14 mL/h via EPIDURAL
  Filled 2016-12-03: qty 100

## 2016-12-03 MED ORDER — TERBUTALINE SULFATE 1 MG/ML IJ SOLN: 0.2500 mg | Freq: Once | INTRAMUSCULAR | Status: DC | PRN

## 2016-12-03 MED ORDER — ONDANSETRON HCL 4 MG/2ML IJ SOLN: 4.0000 mg | INTRAMUSCULAR | Status: DC | PRN

## 2016-12-03 NOTE — Progress Notes (Signed)
Suzanne Mclaughlin is a 35 y.o. G2P0010 at [redacted]w[redacted]d by ultrasound admitted for rupture of membranes  Subjective: Chief Complaint  Patient presents with  . Rupture of Membranes    Objective: BP 117/68 (BP Location: Right Arm)   Pulse 85   Temp 98.5 F (36.9 C) (Oral)   Resp 18   Ht 5\' 5"  (1.651 m)   Wt 70.8 kg (156 lb)   LMP 02/24/2016   BMI 25.96 kg/m  No intake/output data recorded. Total I/O In: 600 [Other:600] Out: -   FHT:  FHR: 130 bpm, variability: moderate,  accelerations:  Present,  decelerations:  Present some variables UC:   regular, every q 2 minutes SVE:   5-6 cm dilated, 90% effaced, -2 station LOT Tracing: cat 1  Labs: Lab Results  Component Value Date   WBC 9.4 12/02/2016   HGB 12.5 12/02/2016   HCT 36.1 12/02/2016   MCV 87.2 12/02/2016   PLT 160 12/02/2016    Assessment / Plan: Arrest in active phase of labor Term gestation Maternal hx melanoma P) IUPC placement. Right exaggerated sims position. Pitocin prn. Amnioinfusion PRN  Anticipated MOD:  NSVD  Sharren Schnurr A 12/03/2016, 6:30 AM

## 2016-12-03 NOTE — Progress Notes (Signed)
Pt voided per L&D RN prior to transfer to [facility]. Pt up to bathroom x3 and unable to void. Bleeding is stable. Bladder scan showed >999 in her bladder. Intermittent straight cath done using sterile technique and 1900cc of amber urine emptied. Pt tolerated well.

## 2016-12-03 NOTE — Anesthesia Postprocedure Evaluation (Signed)
Anesthesia Post Note  Patient: [name]  Procedure(s) Performed: * No procedures listed *  Patient location during evaluation: Mother Baby Anesthesia Type: Epidural Level of consciousness: awake and alert Pain management: pain level controlled Vital Signs Assessment: post-procedure vital signs reviewed and stable Respiratory status: spontaneous breathing and nonlabored ventilation Cardiovascular status: stable Postop Assessment: no headache, patient able to bend at knees, no backache, no signs of nausea or vomiting, epidural receding and adequate PO intake Anesthetic complications: no     Last Vitals:  Vitals:   12/03/16 1445 12/03/16 1559  BP:  109/67  Pulse:  95  Resp:  18  Temp: 36.6 C 36.8 C    Last Pain:  Vitals:   12/03/16 1600  TempSrc:   PainSc: 4    Pain Goal: Patients Stated Pain Goal: 0 (12/03/16 0601)               Jabier Mutton

## 2016-12-03 NOTE — H&P (Signed)
Suzanne Mclaughlin is a 35 y.o. female presenting @ 90 2/[redacted] weeks gestation with SROm clear fluid @ 9 pm. irreg ctx. (+) FM OB History    Gravida Para Term Preterm AB Living   2       1     SAB TAB Ectopic Multiple Live Births     1           Past Medical History:  Diagnosis Date  . Cancer ([place])    melanoma  . GERD (gastroesophageal reflux disease)   . Vaginal Pap smear, abnormal    Past Surgical History:  Procedure Laterality Date  . LEG SURGERY Right 07/2015   1a melanoma removal from lower leg  . NASAL SEPTUM SURGERY  2007  . TONSILLECTOMY AND ADENOIDECTOMY  1997   Family History: family history includes Cancer in her maternal aunt; Hyperlipidemia in her mother; Hypertension in her mother. Social History:  reports that she has never smoked. She has never used smokeless tobacco. She reports that she does not drink alcohol or use drugs.     Maternal Diabetes: No Genetic Screening: Normal Maternal Ultrasounds/Referrals: Abnormal:  Findings:   Fetal renal pyelectasis, Other: right hydronephrosis Fetal Ultrasounds or other Referrals:  Referred to Materal Fetal Medicine  Maternal Substance Abuse:  No Significant Maternal Medications:  Meds include: Other: zofran, prednisone early preg Significant Maternal Lab Results:  Lab values include: Group B Strep negative Other Comments:  maternal hx melanoma, treated for rosacea  Review of Systems  All other systems reviewed and are negative.  History Dilation: 5 Effacement (%): 90 Station: -1 Exam by:: Corinna Capra. Lisa RN Blood pressure 114/62, pulse 85, temperature 97.9 F (36.6 C), temperature source Axillary, resp. rate 18, height 5\' 5"  (1.651 m), weight 70.8 kg (156 lb), last menstrual period 02/24/2016. Exam Physical Exam  Constitutional: She appears well-developed and well-nourished.  HENT:  Head: Atraumatic.  Eyes: EOM are normal.  Neck: Neck supple.  Cardiovascular: Regular rhythm.   Respiratory: Breath sounds normal.  GI:  Soft.  Musculoskeletal: Normal range of motion.  Skin: Skin is warm and dry.  Psychiatric: She has a normal mood and affect.    Prenatal labs: ABO, Rh: --/--/AB POS, AB POS (12/14 2355) Antibody: NEG (12/14 2355) Rubella: Immune (05/26 0000) RPR: Nonreactive (05/26 0000)  HBsAg: Negative (05/26 0000)  HIV: Non-reactive (05/26 0000)  GBS: Negative (11/21 0000)   Assessment/Plan: SROM Term gestation Maternal hx melanoma P) admit routine labs. Analgesic/epidural. Pitocin augmentation prn   Alexsis Kathman A 12/03/2016, 6:01 AM

## 2016-12-03 NOTE — Anesthesia Preprocedure Evaluation (Signed)

## 2016-12-03 NOTE — Progress Notes (Signed)
S: called by RN for pt pushing and poss delivery  O: VE fully +2 station Tracing: baseline 135 (+) variables (+) accels Ctx q 3-4 mins  IMP: Complete w/o pitocin augmentation P) cont pushing. Anticipate SVD in next 30-34mins

## 2016-12-03 NOTE — Anesthesia Procedure Notes (Signed)
Epidural Patient location during procedure: OB  Staffing Anesthesiologist: Montez Hageman Performed: anesthesiologist   Preanesthetic Checklist Completed: patient identified, site marked, surgical consent, pre-op evaluation, timeout performed, IV checked, risks and benefits discussed and monitors and equipment checked  Epidural Patient position: sitting Prep: DuraPrep Patient monitoring: heart rate, continuous pulse ox and blood pressure Approach: midline Location: L3-L4 Injection technique: LOR saline  Needle:  Needle type: Tuohy  Needle gauge: 17 G Needle length: 9 cm and 9 Needle insertion depth: 5 cm Catheter type: closed end flexible Catheter size: 20 Guage Catheter at skin depth: 9 cm Test dose: negative  Assessment Events: blood not aspirated, injection not painful, no injection resistance, negative IV test and no paresthesia  Additional Notes Patient identified. Risks/Benefits/Options discussed with patient including but not limited to bleeding, infection, nerve damage, paralysis, failed block, incomplete pain control, headache, blood pressure changes, nausea, vomiting, reactions to medication both or allergic, itching and postpartum back pain. Confirmed with bedside nurse the patient's most recent platelet count. Confirmed with patient that they are not currently taking any anticoagulation, have any bleeding history or any family history of bleeding disorders. Patient expressed understanding and wished to proceed. All questions were answered. Sterile technique was used throughout the entire procedure. Please see nursing notes for vital signs. Test dose was given through epidural needle and negative prior to continuing to dose epidural or start infusion. Warning signs of high block given to the patient including shortness of breath, tingling/numbness in hands, complete motor block, or any concerning symptoms with instructions to call for help. Patient was given instructions  on fall risk and not to get out of bed. All questions and concerns addressed with instructions to call with any issues.

## 2016-12-03 NOTE — Lactation Note (Signed)
This note was copied from a baby's chart. Lactation Consultation Note  Patient Name: Suzanne Mclaughlin [id number] Date: 12/03/2016 Reason for consult: Initial assessment   Initial consult at 19 hrs old; GA 40.3; BW 7 lbs, 12 oz.  Mom is a P1 with Hx of Melanoma.  Bilateral renal pyelectasis noted on prenatal U/S but prior to delivery upgraded to right hydronephrosis (per MD note).     Infant has breastfed x2 (15 min) + attempt x2 (36min); voids-1; stools-3; LS 7/8 by RNs. Mom states the 2 breastfeedings were on/off few sucks but not a consistent rhythm. Infant very sleepy at current visit. Parents attended prenatal breastfeeding class LC taught a few months ago; we both remembered each other.     Taught hand expression with return demonstration and observation of colostrum slowly flowing onto spoon. Taught how to spoon feed a few drops while infant was at breast.   Spoons, curved-tip syringe, and colostrum collection container given for EBM feedings and to entice infant to latch.   Attempted to latch infant football hold right side but he was too sleepy for latching; hand expressed several drops colostrum directly into his mouth and he would swallow but would not awaken for latching. Reviewed with mom positioning and sandwiching of breast.  Taught dad how to assist with teacup hold.   Reviewed feeding cues, and size of stomach.  Parents anxious that he is sleepy; reassurance given and encouraged them to spoon feed if he is too sleepy for latching; keep watching feeding cues.   Lactation brochure given and informed of hospital support group and outpatient services. Encouraged to call for assistance with latching as needed.     Maternal Data Has patient been taught Hand Expression?: Yes Does the patient have breastfeeding experience prior to this delivery?: No  Feeding Feeding Type: Breast Fed Length of feed: 0 min  LATCH Score/Interventions Latch: Too sleepy or reluctant, no latch achieved,  no sucking elicited.     Type of Nipple: Everted at rest and after stimulation  Comfort (Breast/Nipple): Soft / non-tender     Intervention(s): Breastfeeding basics reviewed;Support Pillows;Skin to skin     Lactation Tools Discussed/Used     Consult Status Consult Status: Follow-up Date: 12/04/16 Follow-up type: In-patient    Merlene Laughter 12/03/2016, 11:32 PM

## 2016-12-03 NOTE — Progress Notes (Signed)
Rn paged Dr Joelyn Oms to update doctor on pt SVE 2/70/-2. And plan of Care for pt. Rn spoke to [employer] Cnm on unit verbally reported sve 2/70/-2. No orders recv'd. Cont poc. Charge [place]field also made aware pt status.

## 2016-12-03 NOTE — Progress Notes (Signed)
Charge Rn Simona Huh recv'd orders via phone from Dr Garwin Brothers. Pt may have epidural at SVE 4-5cm. See orders Cont poc.

## 2016-12-03 NOTE — Anesthesia Pain Management Evaluation Note (Signed)
  CRNA Pain Management Visit Note  Patient: Suzanne Mclaughlin, 35 y.o., female  "Hello I am a member of the anesthesia team at [facility]. We have an anesthesia team available at all times to provide care throughout the hospital, including epidural management and anesthesia for C-section. I don't know your plan for the delivery whether it a natural birth, water birth, IV sedation, nitrous supplementation, doula or epidural, but we want to meet your pain goals."   1.Was your pain managed to your expectations on prior hospitalizations?   No prior hospitalizations  2.What is your expectation for pain management during this hospitalization?     Epidural  3.How can we help you reach that goal? epidural  Record the patient's initial score and the patient's pain goal.   Pain: 2  Pain Goal: 4 [facility] wants you to be able to say your pain was always managed very well.  Sherrita Riederer 12/03/2016

## 2016-12-04 ENCOUNTER — Inpatient Hospital Stay (HOSPITAL_COMMUNITY): Admission: RE | Admit: 2016-12-04 | Payer: Managed Care, Other (non HMO) | Source: Ambulatory Visit

## 2016-12-04 LAB — CBC
HEMATOCRIT: 27.4 % — AB (ref 36.0–46.0)
HEMOGLOBIN: 9.6 g/dL — AB (ref 12.0–15.0)
MCH: 30.2 pg (ref 26.0–34.0)
MCHC: 34.7 g/dL (ref 30.0–36.0)
MCV: 87 fL (ref 78.0–100.0)
Platelets: 148 10*3/uL — ABNORMAL LOW (ref 150–400)
RBC: 3.15 MIL/uL — AB (ref 3.87–5.11)
RDW: 15.1 % (ref 11.5–15.5)
WBC: 12.6 10*3/uL — ABNORMAL HIGH (ref 4.0–10.5)

## 2016-12-04 MED ORDER — POLYSACCHARIDE IRON COMPLEX 150 MG PO CAPS
150.0000 mg | ORAL_CAPSULE | Freq: Every day | ORAL | Status: DC
  Administered 2016-12-04 – 2016-12-05 (×2): 150 mg via ORAL
  Filled 2016-12-04 (×2): qty 1

## 2016-12-04 MED ORDER — HYDROCORTISONE ACE-PRAMOXINE 1-1 % RE CREA: TOPICAL_CREAM | Freq: Four times a day (QID) | RECTAL | Status: DC

## 2016-12-04 MED ORDER — MAGNESIUM OXIDE 400 (241.3 MG) MG PO TABS
400.0000 mg | ORAL_TABLET | Freq: Every day | ORAL | Status: DC
  Administered 2016-12-04: 400 mg via ORAL
  Filled 2016-12-04 (×2): qty 1

## 2016-12-04 MED ORDER — HYDROCORTISONE ACE-PRAMOXINE 1-1 % RE FOAM
1.0000 | Freq: Four times a day (QID) | RECTAL | Status: DC
  Administered 2016-12-04 (×2): 1 via RECTAL
  Filled 2016-12-04 (×2): qty 10

## 2016-12-04 NOTE — Lactation Note (Signed)
This note was copied from a baby's chart. Lactation Consultation Note  Patient Name: Suzanne Mclaughlin [id number] Date: 12/04/2016 Reason for consult: Follow-up assessment Baby at 34 hr of life. Mom was requesting lactation because she was not seen today. She reports baby will latch, suck a few times, come, scream, and re latch. She doe snot think he is latch well because her nipple hurt and he does not stay on long. She has been pumping and spoon or syringe feeding her expressed milk. She is set for D/C tomorrow and is worried about going home when the baby is not feeding well. At this visit baby was calm. He latched easily and maintained short bursts of sucking at the breast without pulling off or screaming. Mom stated this in not how feedings normal go. She has lactation phone on the white board, she will call as needed. She will offer the breast on demand 8+/24hr. She may need to pre express and offer baby a little of her milk before latching to calm him.    Maternal Data    Feeding Feeding Type: Breast Fed Length of feed: 10 min  LATCH Score/Interventions Latch: Grasps breast easily, tongue down, lips flanged, rhythmical sucking. Intervention(s): Waking techniques Intervention(s): Adjust position;Breast compression  Audible Swallowing: Spontaneous and intermittent Intervention(s): Hand expression;Skin to skin  Type of Nipple: Everted at rest and after stimulation  Comfort (Breast/Nipple): Filling, red/small blisters or bruises, mild/mod discomfort  Problem noted: Mild/Moderate discomfort Interventions (Mild/moderate discomfort): Hand expression  Hold (Positioning): Assistance needed to correctly position infant at breast and maintain latch. Intervention(s): Position options;Support Pillows  LATCH Score: 8  Lactation Tools Discussed/Used Pump Review: Setup, frequency, and cleaning;Milk Storage Date initiated::  (she was already using it prior to this visit)   Consult  Status Consult Status: Follow-up Date: 12/05/16 Follow-up type: In-patient    Denzil Hughes 12/04/2016, 10:19 PM

## 2016-12-04 NOTE — Progress Notes (Signed)
PPD # 1 SVD Information for the patient's newborn:  Suzanne Mclaughlin, Suzanne Mclaughlin [id number]  female    Breast feeding  / Circumcision declined Baby name: Peggye Pitt:  Reports feeling well, just pain with hemorrhoids             Tolerating po/ No nausea or vomiting             Bleeding is light             Pain controlled with ibuprofen (OTC)             Up ad lib / ambulatory / voiding without difficulties        O:  A & O x 3, in no apparent distress              VS:  Vitals:   12/03/16 1446 12/03/16 1559 12/03/16 1945 12/04/16 0420  BP: 111/66 109/67 126/65 101/65  Pulse: 81 95 82 76  Resp: 18 18 18 18   Temp: 97.4 F (36.3 C) 98.2 F (36.8 C) 97.8 F (36.6 C) 98.2 F (36.8 C)  TempSrc: Oral Oral Oral Oral  SpO2:   100% 99%  Weight:      Height:        LABS:  Recent Labs  12/02/16 2355 12/04/16 0516  WBC 9.4 12.6*  HGB 12.5 9.6*  HCT 36.1 27.4*  PLT 160 148*    Blood type: --/--/AB POS, AB POS (12/14 2355)  Rubella: Immune (05/26 0000)   I&O: I/O last 3 completed shifts: In: 2514 [I.V.:1875; Other:639] Out: [id number] [Urine:2800; Emesis/NG output:300; Blood:350]          No intake/output data recorded.  Lungs: Clear and unlabored  Heart: regular rate and rhythm / no murmurs  Abdomen: soft, non-tender, non-distended             Fundus: firm, non-tender, U-1  Perineum: mild edema, + cluster hemorrhoids at anus, no thrombosis  Lochia: small  Extremities: +1 edema, no calf pain or tenderness    A/P: PPD # 1 35 y.o., [id number]   Principal Problem:   Postpartum care following vaginal delivery (12/15) Active Problems:   Indication for care in labor or delivery ABL anemia  - oral fe supplement and Mag Ox  Hemorrhoids  - bowel care, proctocream, sidelying breastfeeding  Doing well - stable status  Routine post partum orders  Anticipate discharge tomorrow    Juliene Pina, MSN, CNM 12/04/2016, 11:17 AM

## 2016-12-05 MED ORDER — COCONUT OIL OIL: 1.0000 "application " | TOPICAL_OIL | 0 refills | Status: DC | PRN

## 2016-12-05 MED ORDER — ACETAMINOPHEN 325 MG PO TABS: 650.0000 mg | ORAL_TABLET | ORAL | Status: DC | PRN

## 2016-12-05 MED ORDER — SENNOSIDES-DOCUSATE SODIUM 8.6-50 MG PO TABS: 2.0000 | ORAL_TABLET | ORAL | Status: DC

## 2016-12-05 MED ORDER — WITCH HAZEL-GLYCERIN EX PADS: 1.0000 "application " | MEDICATED_PAD | CUTANEOUS | 12 refills | Status: DC | PRN

## 2016-12-05 MED ORDER — BENZOCAINE-MENTHOL 20-0.5 % EX AERO: 1.0000 "application " | INHALATION_SPRAY | CUTANEOUS | Status: DC | PRN

## 2016-12-05 MED ORDER — MAGNESIUM OXIDE 400 (241.3 MG) MG PO TABS: 400.0000 mg | ORAL_TABLET | Freq: Every day | ORAL | Status: DC

## 2016-12-05 MED ORDER — POLYSACCHARIDE IRON COMPLEX 150 MG PO CAPS: 150.0000 mg | ORAL_CAPSULE | Freq: Every day | ORAL | 1 refills | Status: DC

## 2016-12-05 MED ORDER — IBUPROFEN 600 MG PO TABS: 600.0000 mg | ORAL_TABLET | Freq: Four times a day (QID) | ORAL | 0 refills | Status: DC

## 2016-12-05 MED ORDER — HYDROCORTISONE ACE-PRAMOXINE 1-1 % RE FOAM: 1.0000 | Freq: Four times a day (QID) | RECTAL | 0 refills | Status: DC

## 2016-12-05 NOTE — Discharge Summary (Signed)
Obstetric Discharge Summary Reason for Admission: onset of labor and rupture of membranes Prenatal Procedures: ultrasound Intrapartum Procedures: spontaneous vaginal delivery and epidural Postpartum Procedures: none Complications-Operative and Postpartum: vaginal and sulcus laceration Hemoglobin  Date Value Ref Range Status  12/04/2016 9.6 (L) 12.0 - 15.0 g/dL Final    Comment:    REPEATED TO VERIFY DELTA CHECK NOTED    HCT  Date Value Ref Range Status  12/04/2016 27.4 (L) 36.0 - 46.0 % Final    Physical Exam:  General: alert, cooperative and no distress Lochia: appropriate Uterine Fundus: firm Incision: NA DVT Evaluation: No significant calf/ankle edema, no cords or tenderness  Discharge Diagnoses: Term Pregnancy-delivered and anemia  Discharge Information: Date: 12/05/2016 Activity: pelvic rest Diet: routine Medications: PNV, Ibuprofen, Colace and Iron Condition: stable Instructions: refer to practice specific booklet Discharge to: home Follow-up Information    Suzanne Arscott A, MD. Go in 6 week(s).   Specialty:  Obstetrics and Gynecology Contact information: [address] [place] [address] [phone]           Newborn Data: Live born female Suzanne Mclaughlin Weight: 7 lb 12 oz (3515 g) APGAR: 9, 9  Home with mother.  Suzanne Mclaughlin 12/05/2016, 11:32 AM

## 2016-12-05 NOTE — Progress Notes (Signed)
Post Partum Day #2           Information for the patient's newborn:  Arlyss, Chrisp [id number]  female   Baby name: Danne Baxter Feeding: breast  Subjective: No HA, SOB, CP, F/C, breast symptoms. Pain from hemorrhoids improving. Normal vaginal bleeding, no clots.      Objective:  VS:  Vitals:   12/03/16 1945 12/04/16 0420 12/04/16 1757 12/05/16 0510  BP: 126/65 101/65 106/60 (!) 97/57  Pulse: 82 76 78 72  Resp: 18 18 18 18   Temp: 97.8 F (36.6 C) 98.2 F (36.8 C) 98.3 F (36.8 C) 97.9 F (36.6 C)  TempSrc: Oral Oral Oral   SpO2: 100% 99%    Weight:      Height:        No intake or output data in the 24 hours ending 12/05/16 1111     Recent Labs  12/02/16 2355 12/04/16 0516  WBC 9.4 12.6*  HGB 12.5 9.6*  HCT 36.1 27.4*  PLT 160 148*    Blood type: --/--/AB POS, AB POS (12/14 2355) Rubella: Immune (05/26 0000)    Physical Exam:  General: alert, cooperative and no distress Uterine Fundus: firm Lochia: appropriate Perineum: repair intact, edema small, hemorrhoids stable, no thrombosis DVT Evaluation: No cords or calf tenderness. No significant calf/ankle edema.    Assessment/Plan: PPD # 2 / 35 y.o., [id number] S/P:spontaneous vaginal   Principal Problem:   Postpartum care following vaginal delivery (12/15) Active Problems:   Indication for care in labor or delivery    normal postpartum exam normal post-operative exam  Continue current postpartum care  D/C home   LOS: 3 days   Juliene Pina, CNM, MSN 12/05/2016, 11:11 AM

## 2016-12-05 NOTE — Lactation Note (Addendum)
This note was copied from a baby's chart. Lactation Consultation Note Mother is having pain with BF and does not feel that baby is transferring well.  Assisted her with positioning and breast compression and baby transferred well. Mom reported less pain. He would not suckle on the right breast and quickly fell asleep.  Parents were taught alignment and how to keep baby engaged.  They expressed increased confidence. Outpatient scheduled for 12/08/2016. Informed of support groups.  Patient Name: Suzanne Mclaughlin [id number] Date: 12/05/2016     Maternal Data    Feeding Feeding Type: Breast Fed Length of feed: 15 min  LATCH Score/Interventions Latch: Repeated attempts needed to sustain latch, nipple held in mouth throughout feeding, stimulation needed to elicit sucking reflex.  Audible Swallowing: A few with stimulation  Type of Nipple: Everted at rest and after stimulation  Comfort (Breast/Nipple): Filling, red/small blisters or bruises, mild/mod discomfort     Hold (Positioning): Assistance needed to correctly position infant at breast and maintain latch.  LATCH Score: 6  Lactation Tools Discussed/Used     Consult Status Consult Status: Follow-up Follow-up type: Out-patient    Van Clines 12/05/2016, 10:01 AM

## 2016-12-06 ENCOUNTER — Telehealth (HOSPITAL_COMMUNITY): Payer: Self-pay | Admitting: Lactation Services

## 2016-12-06 NOTE — Telephone Encounter (Signed)
Lactation Telephone Call - returned [phone]  Mom called earlier to confirm [facility] O/P appt for Wednesday and  Thought she may want to switch the time and then decided to keep  The same date and time - per [facility] LC called mom back due to BF questions-  Per mom having problems with baby being to fussy to latch, tried feeding  Him with a curved tip syringe then latching and baby still to fussy to latch.  Per mom milk is in and having problems with engorgement, but has pumped  Off breast milk. Due to latching issues - fed the baby a bottle.  Per mom baby was  at 10 % weight loss when D/C and have a Dr.'s appt. Tomorrow.  Banks reviewed with mom prevention and tx of sore nipples and engorgement. Per mom has a DEBP pump.  LC stressed the importance of making sure the  [place] is compressible like a thinner  Sandwich so the baby can latch with depth. Always soften the 1st breast well prior to feeding 2nd breast  And if the baby only feeds on the 1st breast to soften the 2nd breast down to comfort to protect establishing  Milk supply. If the baby is to fussy to latch - burp the baby prior to latching and check diaper , change if needed. If baby is to fussy may need to give the baby an appetizer 1- 10-15 ml before latching so the baby goes to the breast  Calm. Also will enhance let down if mom and  baby are calm.  LC encouraged mom to call [facility] office # if needed and to keep Wednesday Appt. 12/20 at @ 2:30 p.

## 2016-12-08 ENCOUNTER — Ambulatory Visit (HOSPITAL_COMMUNITY)
Admit: 2016-12-08 | Discharge: 2016-12-08 | Disposition: A | Discharge status: Home / Self Care | Payer: Managed Care, Other (non HMO) | Attending: Obstetrics and Gynecology | Admitting: Obstetrics and Gynecology

## 2016-12-08 NOTE — Lactation Note (Signed)
Lactation Consult  Mother's reason for visit:  Help with engorgement and lactation problems Visit Type:  Feeding assessment Appointment Notes:  ? Milk transfer Consult:  Initial Lactation Consultant:  Suzanne Mclaughlin  ________________________________________________________________________   47 Name:  Suzanne Mclaughlin Date of Birth:  12/03/2015 Pediatrician:  Dr Abner Greenspan Gender:  female Gestational Age: [redacted]w[redacted]d (At Birth) Birth Weight:  7 lb 12 oz (3515 g) Weight at Discharge:  Weight: 7 lb 1.9 oz (3230 g)               Date of Discharge:  12/05/2016 Filed Weights   12/03/16 1214 12/03/16 2357 12/05/16 0505  Weight: 7 lb 12 oz (3515 g) 7 lb 8.8 oz (3425 g) 7 lb 1.9 oz (3230 g)  Last weight taken from location outside of [facility]:  7-0 on 12/06/16     Location:Pediatrician's office Weight today:  7-7.5  ________________________________________________________________________  Mother's Name: Suzanne Mclaughlin Type of delivery:  vaginal Breastfeeding Experience:  First baby  Maternal Medications:  Ibuprofen, PNV's  ________________________________________________________________________  Breastfeeding History (Post Discharge)  Frequency of breastfeeding:  Every 2-3 hours Duration of feeding:  15-30 minutes  Supplementation          Breastmilk:  Volume 60-60ml Frequency:  2-3 times/24 hours   Method:  Bottle,   Pumping  Type of pump:  Medela pump in style Frequency:  2-3 times/24 hours Volume:  90-154ml    Infant Intake and Output Assessment  Voids:  6 in 24 hrs.  Color:  Clear yellow Stools:  6 in 24 hrs.  Color:  Yellow  ________________________________________________________________________  Maternal Breast Assessment  Breast:  Full and Compressible Nipple:  Erect and Reddened Pain level:  0 Pain interventions:  Cream/oil and Expressed breast milk  _______________________________________________________________________ Feeding  Assessment/Evaluation  Mom and 5 day old infant here for feeding assessment.  Mom is having much anxiety about breastfeeding and how things are going.  She comes to visit with numerous questions and concerns.  She states baby latches easily with most feedings but night time feedings baby acts frantic and doesn't latch.  Parents give a bottle when this happens and mom pumps.  Mom also concerned about firm areas in outer aspects of breasts which don't always soften with feeding.  Mom does feel relief and softening when she pumps.  She has been using ice to areas.  FOB present and very supportive.  Observed baby latch well after a few attempts.  Baby nursed actively for 15 minutes on each side.  Good softening of right breast but left breast still had areas of firmness.  No engorgement or s/s of mastitis.  Reassured mom that it is not unusual to have knotty areas as milk supply is getting established.  Mom needed a lot of coaching, praise and reassurance.  I encouraged her often to relax during feeding.  Mom denies pain with latch.  Baby transferred 38 mls after both breasts and still showing feeding cues.  Baby took 30 mls of expressed milk from bottle and content and relaxed.  Baby has gained 7.5 ounces/2 days.  Plan is to continue to feed [name] with feeding cues, begin to use heat and massage prior to and during feeding to firmer tissue, massage and compression during feeding, pump both breasts anytime baby receives a bottle or after breastfeeding if breasts aren't soft, may top baby off with expressed milk if still hungry after breastfeeding.  All questions answered.  Follow up appointment scheduled for 12/14/16 at 1:00  pm.  Initial feeding assessment:  Infant's oral assessment:  Variance/Baby has a short posterior frenulum with some restriction with elevation of tongue.  I did not discuss this with parents.  Follow up for next week to assess milk transfer.  Positioning:  Cross cradle Right breast/left  breast  LATCH documentation:  Latch:  2 = Grasps breast easily, tongue down, lips flanged, rhythmical sucking.  Audible swallowing:  2 = Spontaneous and intermittent  Type of nipple:  2 = Everted at rest and after stimulation  Comfort (Breast/Nipple):  2 = Soft / non-tender  Hold (Positioning):  2 = No assistance needed to correctly position infant at breast  LATCH score:  10  Attached assessment:  Deep  Lips flanged:  No.  Lips untucked:  Yes.    Suck assessment:  Nutritive     Pre-feed weight:  3388 g   Post-feed weight:  3426 g  Amount transferred:  38 ml Amount supplemented:  30 ml

## 2016-12-09 ENCOUNTER — Telehealth (HOSPITAL_COMMUNITY): Payer: Self-pay | Admitting: Lactation Services

## 2016-12-09 NOTE — Telephone Encounter (Signed)
Spoke with patient and confirmed that they will come in for 12:30p outpatient appointment on 12/26 instead of 1:00p.

## 2016-12-14 ENCOUNTER — Ambulatory Visit (HOSPITAL_COMMUNITY)
Admission: RE | Admit: 2016-12-14 | Discharge: 2016-12-14 | Disposition: A | Discharge status: Home / Self Care | Payer: Managed Care, Other (non HMO) | Source: Ambulatory Visit | Attending: Obstetrics & Gynecology | Admitting: Obstetrics & Gynecology

## 2016-12-14 DIAGNOSIS — Z029 Encounter for administrative examinations, unspecified: Secondary | ICD-10-CM | POA: Diagnosis present

## 2016-12-14 NOTE — Lactation Note (Signed)
Lactation Consult [name] at 78days of age for weight check.  Baby has gained from 7#7.5oz to 8#5.2oz since last visit 6 days ago.  Mom reports most feedings given by bottle of her EBM.  Mom latches baby a few times, but mostly he is fussy and not latching well.  Offered to assist today although mom is ok with pumping and bottle feeding.  Baby refusing to latch after mom mostly located baby close to breast and needed assist with  Actually latching baby.  LC offered to attempt NS.  #24 good fit, but baby only transferred 9mls and it was not working well for mom and baby.   Plan mom to pump and bottle feed every 3 hours or on demand.  Mom will continue to attempt latching as desired a few times a day and always supplement after to make sure baby is fed well.  Mom will return to work and mostly pump/bo feed.  Mom to come to support group to monitor weight gain. Mom aware to call with concerns to to schedule follow up appointment.       Mother's reason for visit:  Weight check Visit Type:  Weight check Consult:  Follow-Up Lactation Consultant:  Suzanne Mclaughlin BSN RN Suzanne Mclaughlin  ________________________________________________________________________  Suzanne Mclaughlin Name:  Suzanne Mclaughlin Date of Birth:  12/03/2015 Pediatrician:  Suzanne Mclaughlin Gender:  female Gestational Age: [redacted]w[redacted]d (At Birth) Birth Weight:  7 lb 12 oz (3515 g) Weight at Discharge:  Weight: 7 lb 1.9 oz (3230 g)               Date of Discharge:  12/05/2016      [facility] Weights   12/03/16 1214 12/03/16 2357 12/05/16 0505  Weight: 7 lb 12 oz (3515 g) 7 lb 8.8 oz (3425 g) 7 lb 1.9 oz (3230 g)  Last weight taken from location outside of [facility]: N/AS  Weight today:  8#5.5oz     ________________________________________________________________________  Mother's Name: Suzanne Mclaughlin Type of delivery:  vag Breastfeeding Experience: first  baby ______________________________________________________________________  Breastfeeding History (Post Discharge)  Frequency of breastfeeding:  Every 3 hours Duration of feeding:  Maybe 15 minutes at breast normally bottle fed    Pumping  Type of pump:  Medela pump in style Frequency:  Every 3 hours Volume:  3-5 oz   Infant Intake and Output Assessment  Voids:  8 in 24 hrs.  Color:  Clear yellow Stools:  8 in 24 hrs.  Color:  Yellow  ________________________________________________________________________  Maternal Breast Assessment  Breast:  Full Nipple:  Erect Pain level:  0  _______________________________________________________________________ Feeding Assessment/Evaluation  Initial feeding assessment:  Infant's oral assessment:  Variance Bowl shaped tongue when crying, does not elevate tongue well when sucking.  Mentioned to parents may warrant specialist, parents declined due to plan to pump and bottle feed.   Positioning:  Cross cradle Left breast  LATCH documentation:  Latch:  1 = Repeated attempts needed to sustain latch, nipple held in mouth throughout feeding, stimulation needed to elicit sucking reflex.  Audible swallowing:  1 = A few with stimulation  Type of nipple:  2 = Everted at rest and after stimulation  Comfort (Breast/Nipple):  2 = Soft / non-tender  Hold (Positioning):  1 = Assistance needed to correctly position infant at breast and maintain latch  LATCH score:  7  Attached assessment:  Shallow  Lips flanged:  Yes.    Lips untucked:  Yes.    Suck assessment:  Nonnutritive  Tools:  Nipple shield 24 mm Instructed on use and cleaning of tool:  No. not continuing to use it.   Pre-feed weight:  3784 g  (8 lb. 5.2 oz.) Post-feed weight:  3792 g 8 lb. 5.5 oz.) Amount transferred: 8 ml Amount supplemented:~54mls  No   Total amount transferred:  70ml Total supplement given:  ~60 ml

## 2017-01-11 ENCOUNTER — Ambulatory Visit (HOSPITAL_COMMUNITY)
Admission: RE | Admit: 2017-01-11 | Discharge: 2017-01-11 | Disposition: A | Discharge status: Home / Self Care | Payer: Managed Care, Other (non HMO) | Source: Ambulatory Visit | Attending: Obstetrics and Gynecology | Admitting: Obstetrics and Gynecology

## 2017-01-11 ENCOUNTER — Ambulatory Visit (HOSPITAL_COMMUNITY): Admission: RE | Admit: 2017-01-11 | Payer: Managed Care, Other (non HMO) | Source: Ambulatory Visit

## 2017-01-11 NOTE — Lactation Note (Addendum)
Lactation Consult  Mother's reason for visit:  Mother's  reason for consult is she wants to talk about  transitioning infant back to breast. Mother has not put the baby to breast in 2 weeks .  He refused to latch. Mother is concerned that if she exclusively pumps she will not have enough milk . She reports that she knows that breastfeeding will make more milk than pumping.  Parents report that infant cry's all the time.  Suzanne Mclaughlin is on Amoxicillin . They think this is causing belly pain. Infant has reflux and is on meds. They also report that he has colic and cry's for 2 hours at 9 o clock at night.  Parents are overwhelmed with the crying. They are unsure if this is normal. They have discussed this with  (Peds) who say that this is normal infant behavior .   Visit Type: feeding assessment Consult:  Follow up  Lactation Consultant:  Suzanne Mclaughlin   Mother's Name: Suzanne Mclaughlin Type of delivery: vaginal del    Breastfeeding Experience:  none Maternal Medical Conditions:n/a Maternal Medications: n/a  ________________________________________________________________________  Breastfeeding History (Post Discharge)  Frequency of breastfeeding:  None since 2 weeks Duration of feeding:     Pumping  Type of pump:  Medela pump in style Frequency:  7-8 times daily  Volume: 6-10 ounces  Infant Intake and Output Assessment  Voids:8-10  in 24 hrs.  Color:  Clear yellow Stools:6-7  in 24 hrs.  Color:  Yellow   Evaluation:    Mother states that she goes baby to work in 3 weeks . She has concerns that she want make enough milk. Mother assured that if she continues to pump consistently she should not have problems with milk supply. Mother unsure if she really wanted to put infant back to breast. Lots of discussion about what she felt was best for the infant and for her family. With all the crying that Suzanne Mclaughlin does, Mother decides that she wants to pump and bottle feed only.  Allowed  parents to talk over all issues until they felt all their questions were answered.  Infant was given a bottle with 2.5 ounces by Mother. After bottle infant did cry for a few mins. Mother was able to console infant .  Unable to get infant to burp. Mother wanted to weigh infant . He stopped crying to allow for clothes to be changed and diaper.  Recommend that parents get a Moby Wrap for holding infant next to their chest. Recommend that they give each other breaks from Suzanne Mclaughlin crying.  Suggested that mother go off all diary products for 1-2 weeks to see if there is a change in her infants crying. Infant is taking 2-3 ounces every 2 hours. Advised mother to start giving at least 3 ounces every 2-3 hours.   Mother receptive to all ideas Advised to follow up with Peds at regular appt, but if having concerns call Peds  Pre-feed weight:10-14       Total supplement given:  Infant was given 2.5 ounces of ebm with a bottle by mother,

## 2017-04-11 ENCOUNTER — Encounter: Payer: Self-pay | Admitting: Internal Medicine

## 2017-04-11 ENCOUNTER — Ambulatory Visit (INDEPENDENT_AMBULATORY_CARE_PROVIDER_SITE_OTHER): Payer: Managed Care, Other (non HMO) | Admitting: Internal Medicine

## 2017-04-11 ENCOUNTER — Other Ambulatory Visit (INDEPENDENT_AMBULATORY_CARE_PROVIDER_SITE_OTHER): Payer: Managed Care, Other (non HMO)

## 2017-04-11 VITALS — BP 100/62 | HR 65 | Temp 97.7°F | Resp 12 | Ht 65.0 in | Wt 124.0 lb

## 2017-04-11 DIAGNOSIS — Z0000 Encounter for general adult medical examination without abnormal findings: Secondary | ICD-10-CM

## 2017-04-11 DIAGNOSIS — L709 Acne, unspecified: Secondary | ICD-10-CM | POA: Diagnosis not present

## 2017-04-11 DIAGNOSIS — K219 Gastro-esophageal reflux disease without esophagitis: Secondary | ICD-10-CM

## 2017-04-11 LAB — CBC
HEMATOCRIT: 41.2 % (ref 36.0–46.0)
Hemoglobin: 14 g/dL (ref 12.0–15.0)
MCHC: 33.9 g/dL (ref 30.0–36.0)
MCV: 87.1 fl (ref 78.0–100.0)
Platelets: 231 10*3/uL (ref 150.0–400.0)
RBC: 4.73 Mil/uL (ref 3.87–5.11)
RDW: 12.3 % (ref 11.5–15.5)
WBC: 6.1 10*3/uL (ref 4.0–10.5)

## 2017-04-11 LAB — T4, FREE: Free T4: 0.88 ng/dL (ref 0.60–1.60)

## 2017-04-11 LAB — VITAMIN B12: VITAMIN B 12: 1032 pg/mL — AB (ref 211–911)

## 2017-04-11 LAB — VITAMIN D 25 HYDROXY (VIT D DEFICIENCY, FRACTURES): VITD: 48.62 ng/mL (ref 30.00–100.00)

## 2017-04-11 LAB — TSH: TSH: 1.16 u[IU]/mL (ref 0.35–4.50)

## 2017-04-11 MED ORDER — LIDOCAINE VISCOUS 2 % MT SOLN: 20.0000 mL | OROMUCOSAL | 0 refills | Status: DC | PRN

## 2017-04-11 NOTE — Assessment & Plan Note (Signed)
Seeing derm every 6 months and taking ampicillin daily.

## 2017-04-11 NOTE — Progress Notes (Signed)
Pre visit review using our clinic review tool, if applicable. No additional management support is needed unless otherwise documented below in the visit note. 

## 2017-04-11 NOTE — Patient Instructions (Signed)
We will do the blood work today and call you back about the results.

## 2017-04-11 NOTE — Assessment & Plan Note (Signed)
Checking labs and adjust as needed. Pap smear up to date. Tetanus and flu up to date. Counseled about sun safety and mole surveillance (sees derm q 6 months for past melanoma). Given screening recommendations.

## 2017-04-11 NOTE — Progress Notes (Signed)
   Subjective:    Patient ID: [name], female    DOB: 07/28/1981, 36 y.o.   MRN: [id number]  HPI The patient is a 36 YO female coming in for wellness. Is having a few post partum problems including hair thinning and some sore throat with mild allergy symptoms.   PMH, [facility], social history reviewed and updated.   Review of Systems  Constitutional: Negative.   HENT: Positive for sore throat. Negative for congestion, dental problem, facial swelling, mouth sores and postnasal drip.   Eyes: Negative.   Respiratory: Negative for cough, chest tightness and shortness of breath.   Cardiovascular: Negative for chest pain, palpitations and leg swelling.  Gastrointestinal: Negative for abdominal distention, abdominal pain, constipation, diarrhea, nausea and vomiting.  Endocrine:       Hair thinning  Musculoskeletal: Negative.   Skin: Negative.   Neurological: Negative.   Psychiatric/Behavioral: Negative.       Objective:   Physical Exam  Constitutional: She is oriented to person, place, and time. She appears well-developed and well-nourished.  HENT:  Head: Normocephalic and atraumatic.  Eyes: EOM are normal.  Neck: Normal range of motion.  Cardiovascular: Normal rate and regular rhythm.   Pulmonary/Chest: Effort normal and breath sounds normal. No respiratory distress. She has no wheezes. She has no rales.  Abdominal: Soft. Bowel sounds are normal. She exhibits no distension. There is no tenderness. There is no rebound.  Musculoskeletal: She exhibits no edema.  Neurological: She is alert and oriented to person, place, and time. Coordination normal.  Skin: Skin is warm and dry.  Psychiatric: She has a normal mood and affect.   Vitals:   04/11/17 1327  BP: 100/62  Pulse: 65  Resp: 12  Temp: 97.7 F (36.5 C)  TempSrc: Oral  SpO2: 99%  Weight: 124 lb (56.2 kg)  Height: 5\' 5"  (1.651 m)      Assessment & Plan:

## 2017-04-11 NOTE — Assessment & Plan Note (Signed)
Symptoms resolved at this time off meds with diet changes.

## 2017-04-12 LAB — LIPID PANEL
CHOL/HDL RATIO: 2
Cholesterol: 175 mg/dL (ref 0–200)
HDL: 73.6 mg/dL (ref >39.00)
LDL Cholesterol: 92 mg/dL (ref 0–99)
NONHDL: 101.17
TRIGLYCERIDES: 45 mg/dL (ref 0.0–149.0)
VLDL: 9 mg/dL (ref 0.0–40.0)

## 2017-04-12 LAB — COMPREHENSIVE METABOLIC PANEL
ALT: 13 U/L (ref 0–35)
AST: 17 U/L (ref 0–37)
Albumin: 4.8 g/dL (ref 3.5–5.2)
Alkaline Phosphatase: 72 U/L (ref 39–117)
BUN: 15 mg/dL (ref 6–23)
CO2: 27 meq/L (ref 19–32)
CREATININE: 0.76 mg/dL (ref 0.40–1.20)
Calcium: 9.6 mg/dL (ref 8.4–10.5)
Chloride: 103 mEq/L (ref 96–112)
GFR: 92.05 mL/min (ref >60.00)
GLUCOSE: 84 mg/dL (ref 70–99)
Potassium: 4.2 mEq/L (ref 3.5–5.1)
SODIUM: 140 meq/L (ref 135–145)
Total Bilirubin: 0.4 mg/dL (ref 0.2–1.2)
Total Protein: 7.4 g/dL (ref 6.0–8.3)

## 2017-11-16 ENCOUNTER — Ambulatory Visit (INDEPENDENT_AMBULATORY_CARE_PROVIDER_SITE_OTHER): Payer: Managed Care, Other (non HMO) | Admitting: Family Medicine

## 2017-11-16 ENCOUNTER — Encounter: Payer: Self-pay | Admitting: Family Medicine

## 2017-11-16 VITALS — BP 100/60 | HR 83 | Temp 98.0°F | Wt 114.0 lb

## 2017-11-16 DIAGNOSIS — B9789 Other viral agents as the cause of diseases classified elsewhere: Secondary | ICD-10-CM

## 2017-11-16 DIAGNOSIS — J069 Acute upper respiratory infection, unspecified: Secondary | ICD-10-CM | POA: Diagnosis not present

## 2017-11-16 NOTE — Patient Instructions (Signed)
Suspect viral syndrome Stay well hydrated Consider OTC Ibuprofen or Tylenol as needed for sore throat, body aches, headache.

## 2017-11-16 NOTE — Progress Notes (Signed)
Subjective:     Patient ID: Suzanne Mclaughlin, female   DOB: September 13, 1981, 36 y.o.   MRN: [id number]  HPI Patient is seen as a work in with onset a few days ago of sore throat, cough, nasal congestion, headaches, and fatigue. Over Thanksgiving she was around a couple of nieces who had upper respiratory type symptoms. She is nonsmoker. No history of asthma. Very severe sore throat last night which did improve somewhat with Tylenol. She also had some headache which improved with Tylenol. No documented fever  Past Medical History:  Diagnosis Date  . Cancer ([place])    melanoma  . GERD (gastroesophageal reflux disease)   . Postpartum care following vaginal delivery (12/15) 12/03/2016  . Vaginal Pap smear, abnormal    Past Surgical History:  Procedure Laterality Date  . LEG SURGERY Right 07/2015   1a melanoma removal from lower leg  . NASAL SEPTUM SURGERY  2007  . TONSILLECTOMY AND ADENOIDECTOMY  1997    reports that  has never smoked. she has never used smokeless tobacco. She reports that she does not drink alcohol or use drugs. family history includes Cancer in her maternal aunt; Hyperlipidemia in her mother; Hypertension in her mother. No Known Allergies   Review of Systems  Constitutional: Positive for fatigue. Negative for fever.  HENT: Positive for congestion and sore throat.   Respiratory: Positive for cough.   Neurological: Positive for headaches.       Objective:   Physical Exam  Constitutional: She appears well-developed and well-nourished.  HENT:  Right Ear: External ear normal.  Left Ear: External ear normal.  Mouth/Throat: Oropharynx is clear and moist.  Neck: Neck supple.  Cardiovascular: Normal rate.  Pulmonary/Chest: Effort normal and breath sounds normal. No respiratory distress. She has no wheezes. She has no rales.  Lymphadenopathy:    She has no cervical adenopathy.  Neurological: She is alert.       Assessment:     Probable viral URI with cough. Nonfocal exam      Plan:     -Symptomatic treatment with plenty of rest, fluids, ibuprofen or Tylenol as needed for body aches and headaches and follow-up for any fever or worsening symptoms  Eulas Post MD [facility] at [facility]

## 2017-11-17 ENCOUNTER — Telehealth: Payer: Self-pay | Admitting: Family Medicine

## 2017-11-17 NOTE — Telephone Encounter (Signed)
I called and left a voice message for pt, provider is not in the office today, could only recommend over the counter Delsym for cough, did look at office note too. I will forward to provider for review.

## 2017-11-18 ENCOUNTER — Encounter: Payer: Self-pay | Admitting: Family Medicine

## 2017-11-18 ENCOUNTER — Ambulatory Visit (INDEPENDENT_AMBULATORY_CARE_PROVIDER_SITE_OTHER): Payer: Managed Care, Other (non HMO) | Admitting: Family Medicine

## 2017-11-18 ENCOUNTER — Other Ambulatory Visit: Payer: Self-pay | Admitting: Internal Medicine

## 2017-11-18 VITALS — BP 98/66 | HR 72 | Temp 98.9°F | Wt 121.1 lb

## 2017-11-18 DIAGNOSIS — B9789 Other viral agents as the cause of diseases classified elsewhere: Secondary | ICD-10-CM | POA: Diagnosis not present

## 2017-11-18 DIAGNOSIS — J069 Acute upper respiratory infection, unspecified: Secondary | ICD-10-CM

## 2017-11-18 MED ORDER — PREDNISONE 10 MG PO TABS: ORAL_TABLET | ORAL | 0 refills | Status: DC

## 2017-11-18 NOTE — Progress Notes (Signed)
Subjective:     Patient ID: Suzanne Mclaughlin, female   DOB: 1981/06/20, 36 y.o.   MRN: [id number]  HPI Patient seen for follow-up. Was just seen here recently with onset a few days ago of viral upper respiratory symptoms. She has some persistent cough and congestion but her main complaint is severe sore throat. No fever. She's had previous tonsillectomy. She's tried over-the-counter remedies for sore throat but this has remained severe. Pain with swallowing. No dysphagia.  Past Medical History:  Diagnosis Date  . Cancer ([place])    melanoma  . GERD (gastroesophageal reflux disease)   . Postpartum care following vaginal delivery (12/15) 12/03/2016  . Vaginal Pap smear, abnormal    Past Surgical History:  Procedure Laterality Date  . LEG SURGERY Right 07/2015   1a melanoma removal from lower leg  . NASAL SEPTUM SURGERY  2007  . TONSILLECTOMY AND ADENOIDECTOMY  1997    reports that  has never smoked. she has never used smokeless tobacco. She reports that she does not drink alcohol or use drugs. family history includes Cancer in her maternal aunt; Hyperlipidemia in her mother; Hypertension in her mother. No Known Allergies   Review of Systems  Constitutional: Negative for chills and fever.  HENT: Positive for congestion and sore throat.   Respiratory: Positive for cough.        Objective:   Physical Exam  Constitutional: She appears well-developed and well-nourished.  HENT:  She's had previous tonsillectomy. No exudate. No lesions  Neck: Neck supple.  Cardiovascular: Normal rate and regular rhythm.  Pulmonary/Chest: Effort normal and breath sounds normal. No respiratory distress. She has no wheezes. She has no rales.  Lymphadenopathy:    She has no cervical adenopathy.       Assessment:     Sore throat. Suspect viral. She does not have any anterior cervical adenopathy, fever, exudate, etc. to suggest strep and presence of cough makes this more likely viral    Plan:      -Agreed to brief prednisone taper -Push fluids -Follow-up immediately for any fever or worsening symptoms  Eulas Post MD [facility] at [facility]

## 2017-11-18 NOTE — Telephone Encounter (Signed)
Left message for pt to contact pharmacy regarding refill. [facility] received receipt confirmation of prescription.

## 2017-11-18 NOTE — Telephone Encounter (Signed)
Appointment made

## 2017-11-18 NOTE — Telephone Encounter (Signed)
Copied from [place]. Topic: Inquiry >> Nov 18, 2017  5:31 PM [name] Pila B wrote: Reason for CRM: pt called to state the pharmacy has not gotten the prednisone Rx, contact pharmacy if needed

## 2017-11-29 MED FILL — DOXYCYCLINE HYCLATE 100 MG: 100 | 30 days supply | Qty: 30 | Fill #0

## 2017-11-29 MED FILL — SPIRONOLACTONE 50 MG TABLET: 50 | 30 days supply | Qty: 60 | Fill #0

## 2017-12-26 MED FILL — DOXYCYCLINE HYCLATE 100 MG: 100 | 30 days supply | Qty: 30 | Fill #0

## 2018-01-26 MED FILL — DOXYCYCLINE HYCLATE 100 MG: 100 | 30 days supply | Qty: 30 | Fill #1

## 2018-01-30 MED FILL — SPIRONOLACTONE 50 MG TABLET: 50 | 30 days supply | Qty: 60 | Fill #0

## 2018-03-17 MED FILL — DOXYCYCLINE HYCLATE 100 MG: 100 | 30 days supply | Qty: 30 | Fill #2

## 2018-03-30 MED FILL — SPIRONOLACTONE 50 MG TABLET: 50 | 30 days supply | Qty: 60 | Fill #1

## 2018-03-31 ENCOUNTER — Other Ambulatory Visit: Payer: Self-pay | Admitting: Obstetrics and Gynecology

## 2018-03-31 DIAGNOSIS — N644 Mastodynia: Secondary | ICD-10-CM

## 2018-03-31 DIAGNOSIS — R922 Inconclusive mammogram: Secondary | ICD-10-CM

## 2018-03-31 LAB — HM PAP SMEAR

## 2018-04-05 ENCOUNTER — Other Ambulatory Visit: Payer: Managed Care, Other (non HMO)

## 2018-04-06 ENCOUNTER — Ambulatory Visit
Admission: RE | Admit: 2018-04-06 | Discharge: 2018-04-06 | Disposition: A | Discharge status: Home / Self Care | Payer: Managed Care, Other (non HMO) | Source: Ambulatory Visit | Attending: Obstetrics and Gynecology | Admitting: Obstetrics and Gynecology

## 2018-04-06 ENCOUNTER — Other Ambulatory Visit: Payer: Self-pay | Admitting: Obstetrics and Gynecology

## 2018-04-06 DIAGNOSIS — R922 Inconclusive mammogram: Secondary | ICD-10-CM

## 2018-04-06 DIAGNOSIS — N6310 Unspecified lump in the right breast, unspecified quadrant: Secondary | ICD-10-CM

## 2018-04-06 DIAGNOSIS — R9230 Dense breasts, unspecified: Secondary | ICD-10-CM

## 2018-04-06 DIAGNOSIS — N644 Mastodynia: Secondary | ICD-10-CM

## 2018-04-06 IMAGING — US ULTRASOUND LEFT BREAST LIMITED
1 series · 4 of 4 positions shown · non-contrast
Comparison: None.

CLINICAL DATA: 36-year-old female presenting with intermittent
focal breast pain in the upper-outer quadrant of the left breast.
The patient has stopped breast feeding of approximately 4 months
ago. Of note, she has history of melanoma.

EXAM:
DIGITAL DIAGNOSTIC BILATERAL MAMMOGRAM WITH CAD AND TOMO
BILATERAL BREAST ULTRASOUND

[Series 1: ultrasound left breast limited · 0.06mm/px · 4 of 4 slices shown]
[im 1/4]
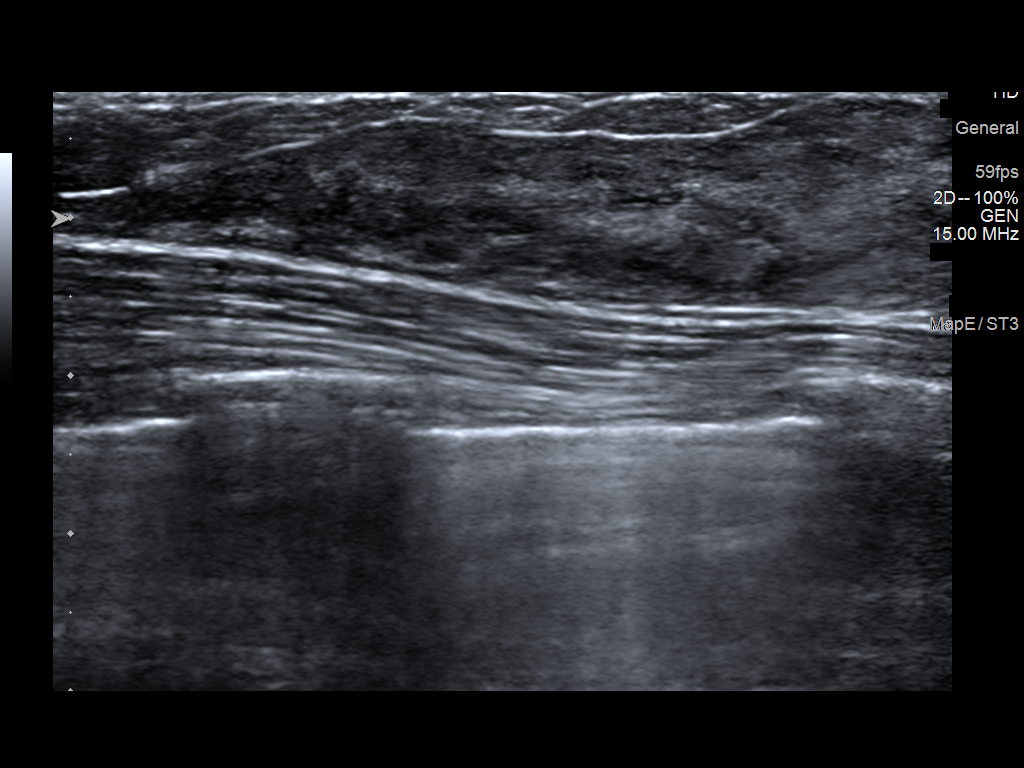
[im 2/4]
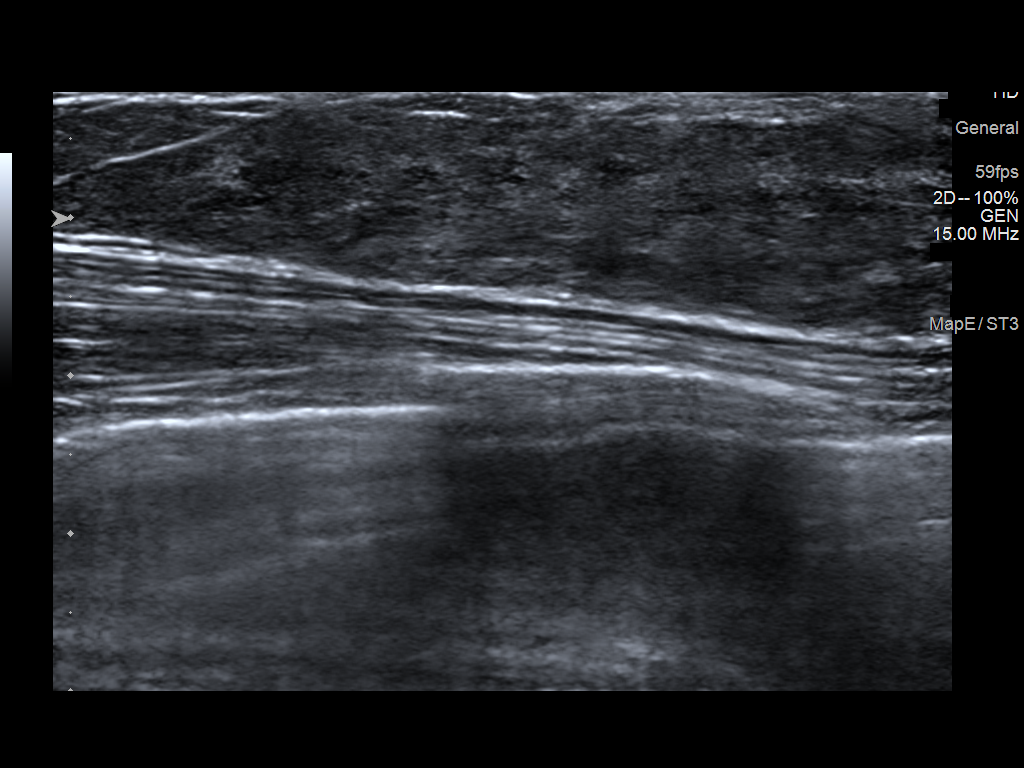
[im 3/4]
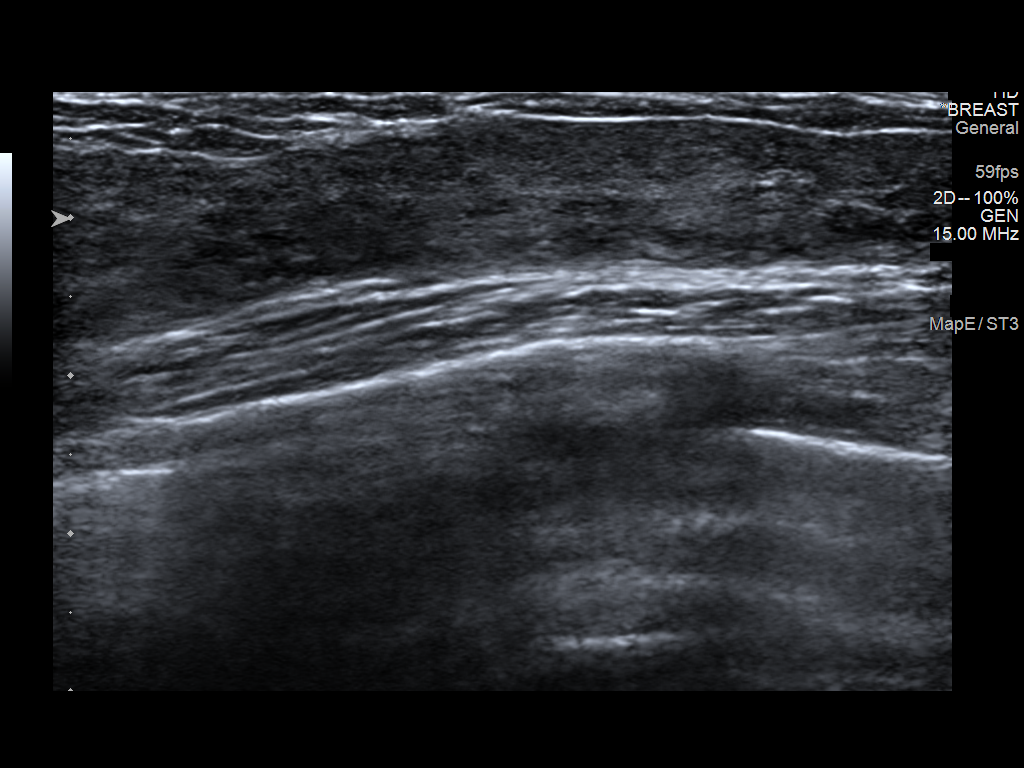
[im 4/4]
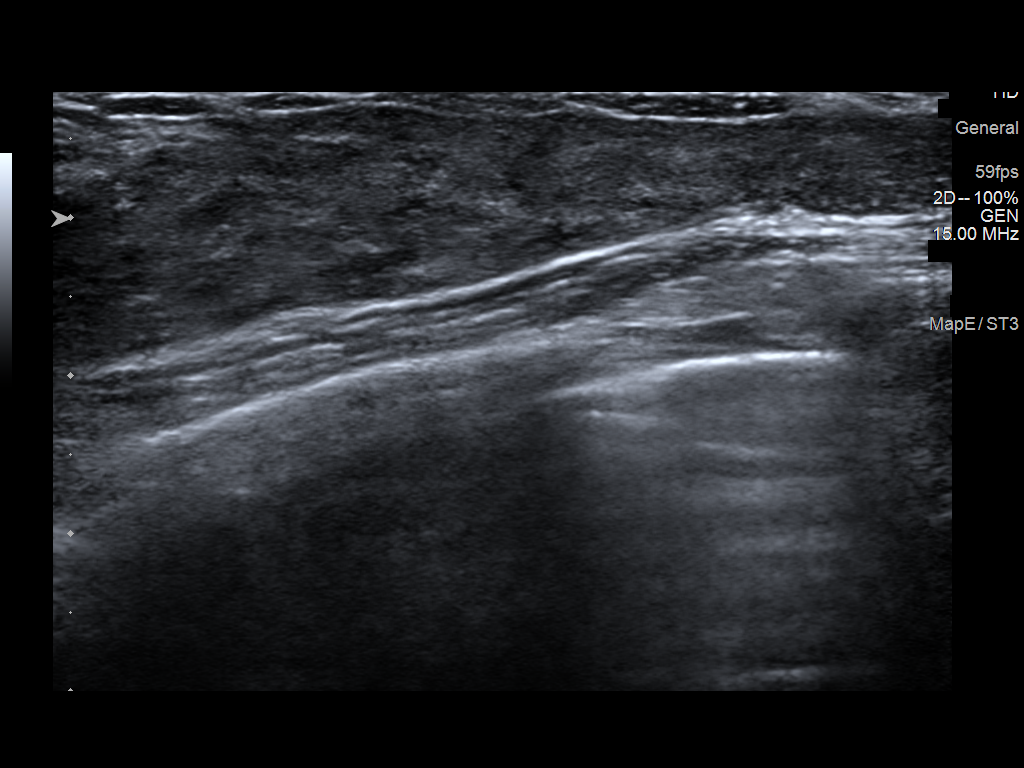

[4 of 4 positions shown; findings below may reference images not displayed]

ACR Breast Density Category d: The breast tissue is extremely dense,
which lowers the sensitivity of mammography.
FINDINGS: In the superior slightly lateral aspect of the right breast there is
a high density oval mass measuring 9 mm. There is a thin lucent rim
of fat surrounding the mass. There are no suspicious findings in the
upper-outer quadrant of the left breast to explain the patient's
breast pain. No suspicious calcifications, masses or areas of
distortion are seen in the bilateral breasts.

Mammographic images were processed with CAD.

Physical exam of the superior right breast demonstrates no discrete
palpable lumps. No palpable masses are identified in the upper-outer
quadrant of the left breast at the site of intermittent focal pain.
Tenderness is elicited adjacent to the nipple in the upper-outer
left breast.

Ultrasound targeted to the right breast at 12 o'clock, 1 cm from the
nipple demonstrates a predominantly hyperechoic oval circumscribed
mass measuring 1.2 x 0.7 x 0.9 cm. There is a small hypoechoic
portion within the mass. No evidence of right axillary
lymphadenopathy.

Ultrasound of the upper-outer left breast demonstrates normal
fibroglandular tissue. No masses or suspicious areas of shadowing
are identified.
IMPRESSION: 1. There are no mammographic or targeted sonographic abnormalities
in the upper-outer left breast at the site of intermittent focal
pain.

2. There is an indeterminate mass in the right breast at 12 o'clock.

3.  No evidence of right axillary lymphadenopathy.

RECOMMENDATION:
1. Ultrasound-guided aspiration versus biopsy of the mass in the
right breast at 12 o'clock is recommended. This could represent a
galactocele given the recent history of cessation of breastfeeding.
This procedure has been scheduled for [DATE] at [DATE] p.m..

2. Clinical follow-up recommended for the painful area of concern in
the upper-outer left breast. Any further workup should be based on
clinical grounds.

I have discussed the findings and recommendations with the patient.
Results were also provided in writing at the conclusion of the
visit. If applicable, a reminder letter will be sent to the patient
regarding the next appointment.

BI-RADS CATEGORY  4: Suspicious.

## 2018-04-06 IMAGING — US ULTRASOUND RIGHT BREAST LIMITED
1 series · 10 of 10 positions shown · non-contrast
Comparison: None.

CLINICAL DATA: 36-year-old female presenting with intermittent
focal breast pain in the upper-outer quadrant of the left breast.
The patient has stopped breast feeding of approximately 4 months
ago. Of note, she has history of melanoma.

EXAM:
DIGITAL DIAGNOSTIC BILATERAL MAMMOGRAM WITH CAD AND TOMO
BILATERAL BREAST ULTRASOUND

[Series 1: ultrasound right breast limited · 0.06mm/px · 10 of 10 slices shown]
[im 1/10]
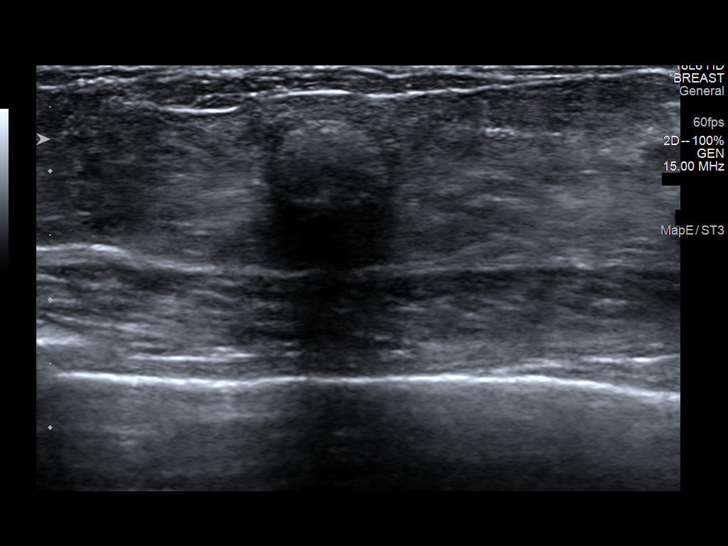
[im 2/10]
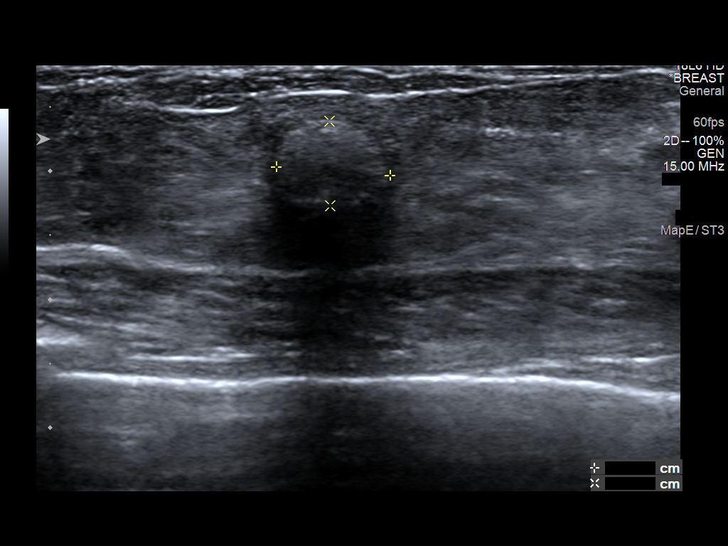
[im 3/10]
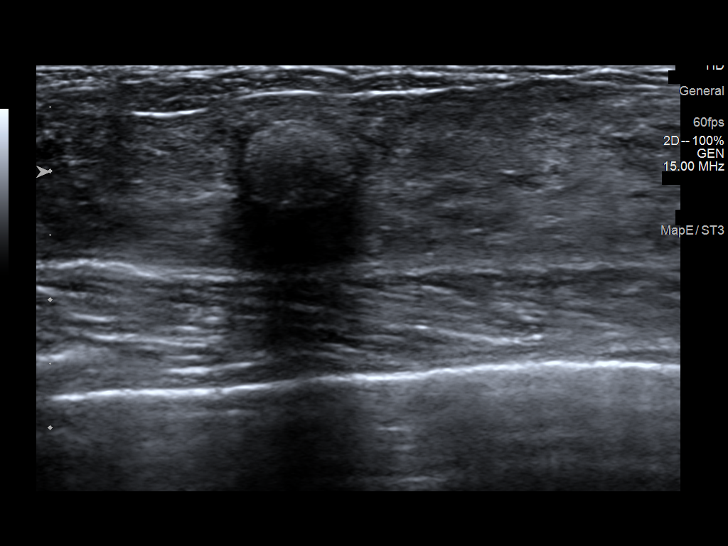
[im 4/10]
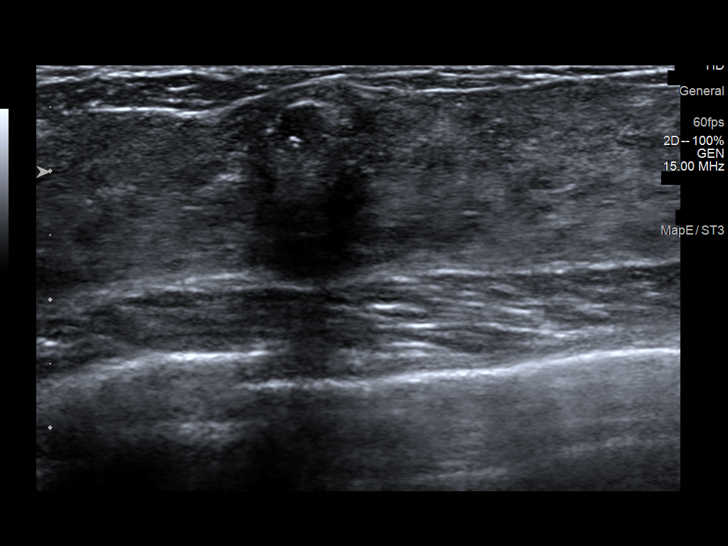
[im 5/10]
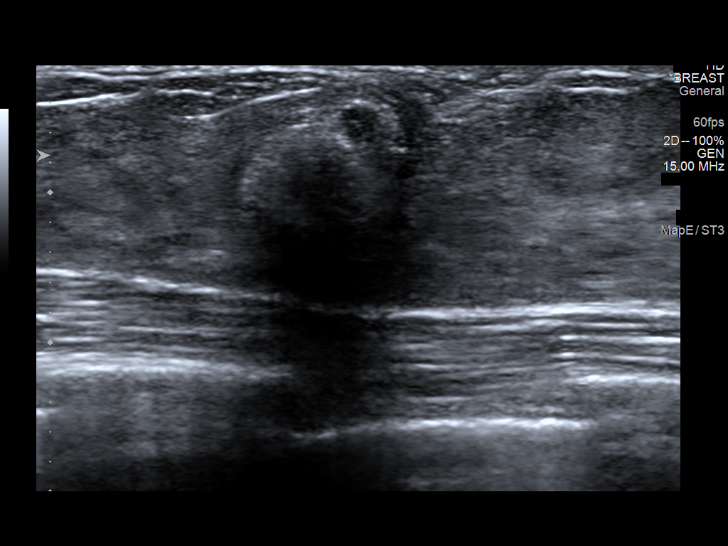
[im 6/10]
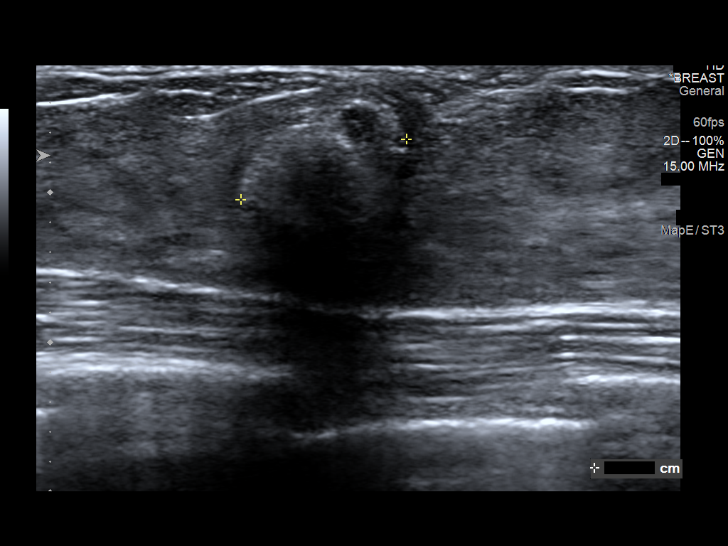
[im 7/10]
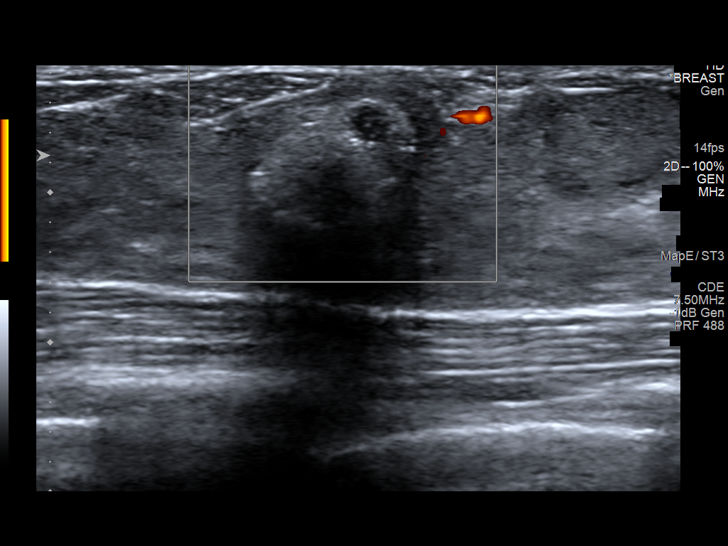
[im 8/10]
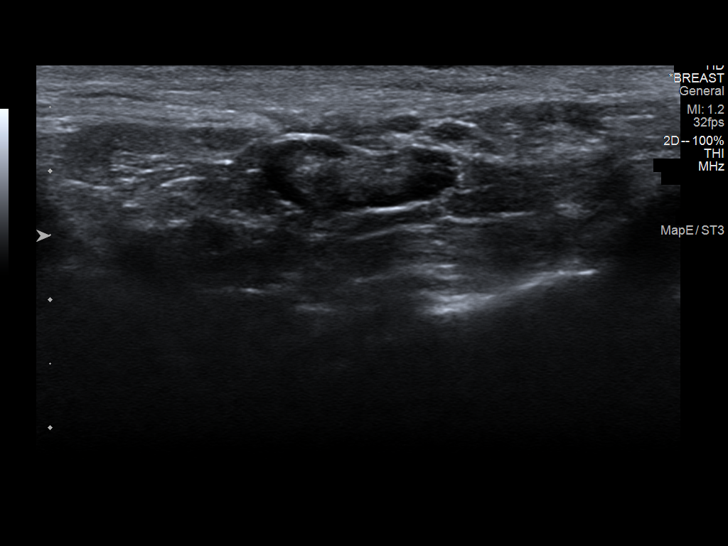
[im 9/10]
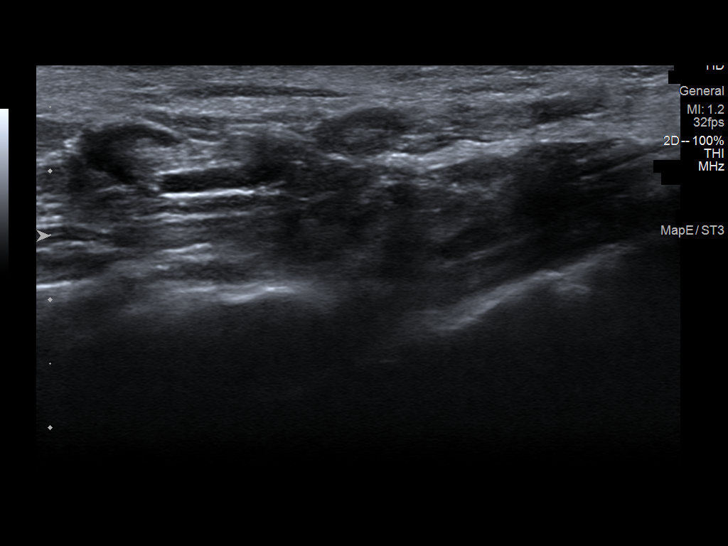
[im 10/10]
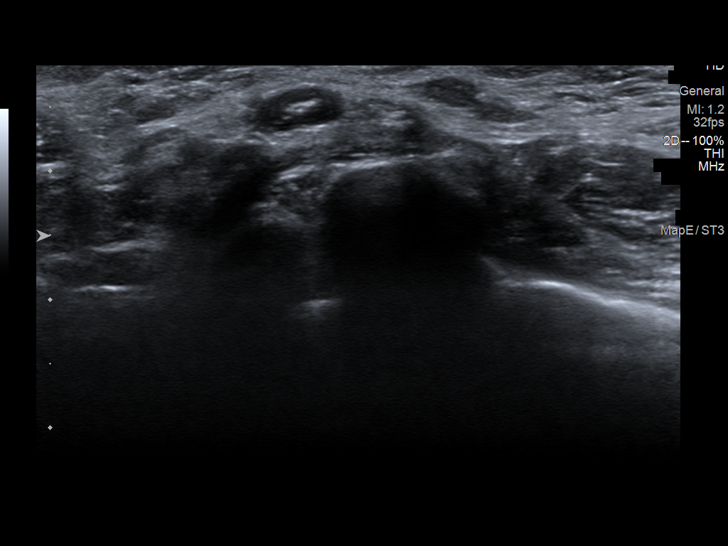

[10 of 10 positions shown; findings below may reference images not displayed]

ACR Breast Density Category d: The breast tissue is extremely dense,
which lowers the sensitivity of mammography.
FINDINGS: In the superior slightly lateral aspect of the right breast there is
a high density oval mass measuring 9 mm. There is a thin lucent rim
of fat surrounding the mass. There are no suspicious findings in the
upper-outer quadrant of the left breast to explain the patient's
breast pain. No suspicious calcifications, masses or areas of
distortion are seen in the bilateral breasts.

Mammographic images were processed with CAD.

Physical exam of the superior right breast demonstrates no discrete
palpable lumps. No palpable masses are identified in the upper-outer
quadrant of the left breast at the site of intermittent focal pain.
Tenderness is elicited adjacent to the nipple in the upper-outer
left breast.

Ultrasound targeted to the right breast at 12 o'clock, 1 cm from the
nipple demonstrates a predominantly hyperechoic oval circumscribed
mass measuring 1.2 x 0.7 x 0.9 cm. There is a small hypoechoic
portion within the mass. No evidence of right axillary
lymphadenopathy.

Ultrasound of the upper-outer left breast demonstrates normal
fibroglandular tissue. No masses or suspicious areas of shadowing
are identified.
IMPRESSION: 1. There are no mammographic or targeted sonographic abnormalities
in the upper-outer left breast at the site of intermittent focal
pain.

2. There is an indeterminate mass in the right breast at 12 o'clock.

3.  No evidence of right axillary lymphadenopathy.

RECOMMENDATION:
1. Ultrasound-guided aspiration versus biopsy of the mass in the
right breast at 12 o'clock is recommended. This could represent a
galactocele given the recent history of cessation of breastfeeding.
This procedure has been scheduled for [DATE] at [DATE] p.m..

2. Clinical follow-up recommended for the painful area of concern in
the upper-outer left breast. Any further workup should be based on
clinical grounds.

I have discussed the findings and recommendations with the patient.
Results were also provided in writing at the conclusion of the
visit. If applicable, a reminder letter will be sent to the patient
regarding the next appointment.

BI-RADS CATEGORY  4: Suspicious.

## 2018-04-06 IMAGING — MG DIGITAL DIAGNOSTIC BILATERAL MAMMOGRAM WITH TOMO AND CAD
8 series · 8 of 24 positions shown · non-contrast
Comparison: None.

CLINICAL DATA: 36-year-old female presenting with intermittent
focal breast pain in the upper-outer quadrant of the left breast.
The patient has stopped breast feeding of approximately 4 months
ago. Of note, she has history of melanoma.

EXAM:
DIGITAL DIAGNOSTIC BILATERAL MAMMOGRAM WITH CAD AND TOMO
BILATERAL BREAST ULTRASOUND

[R CC synth-2D]
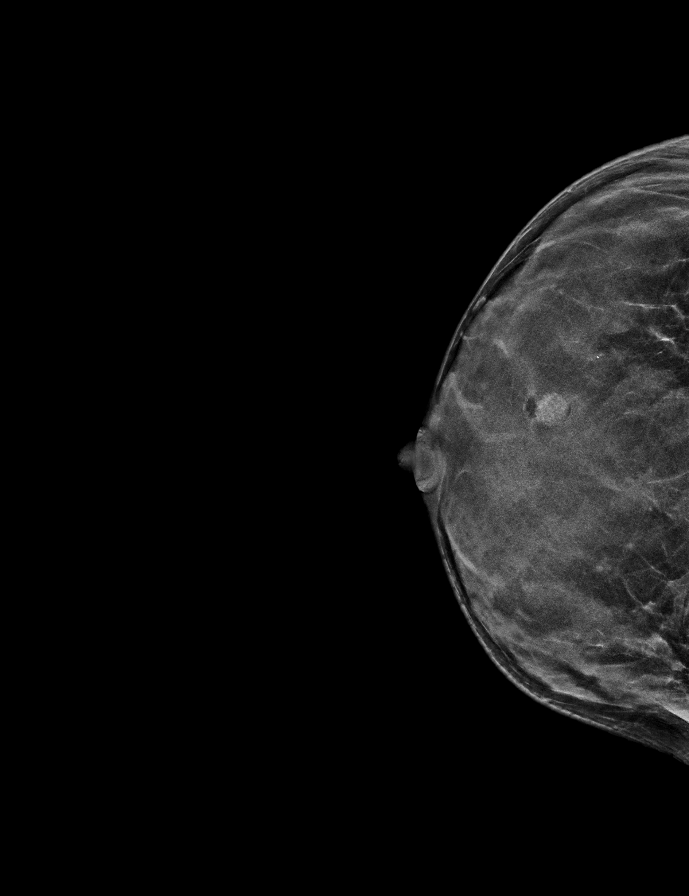

[L CC synth-2D]
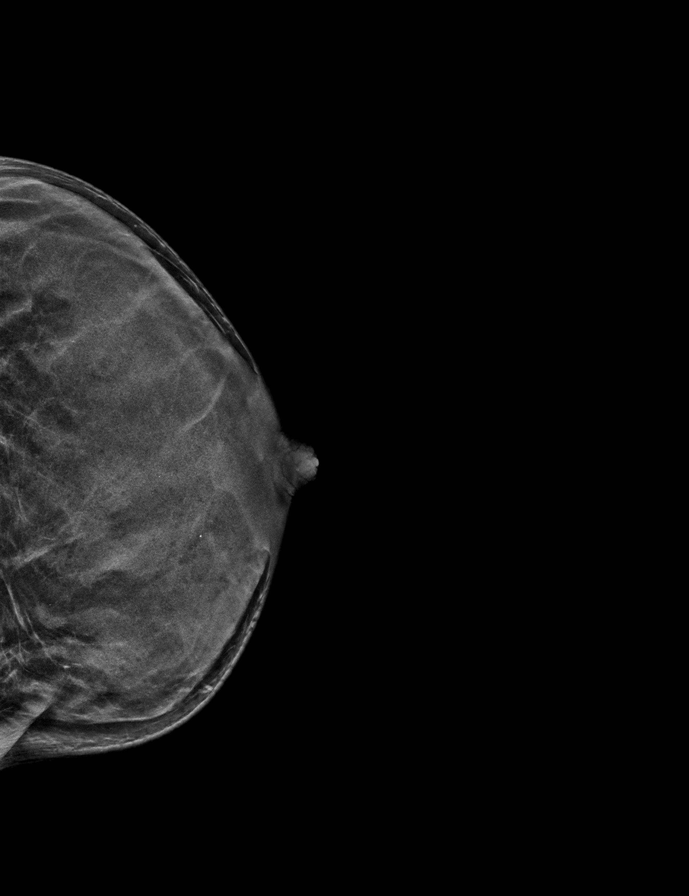

[L MLO synth-2D]
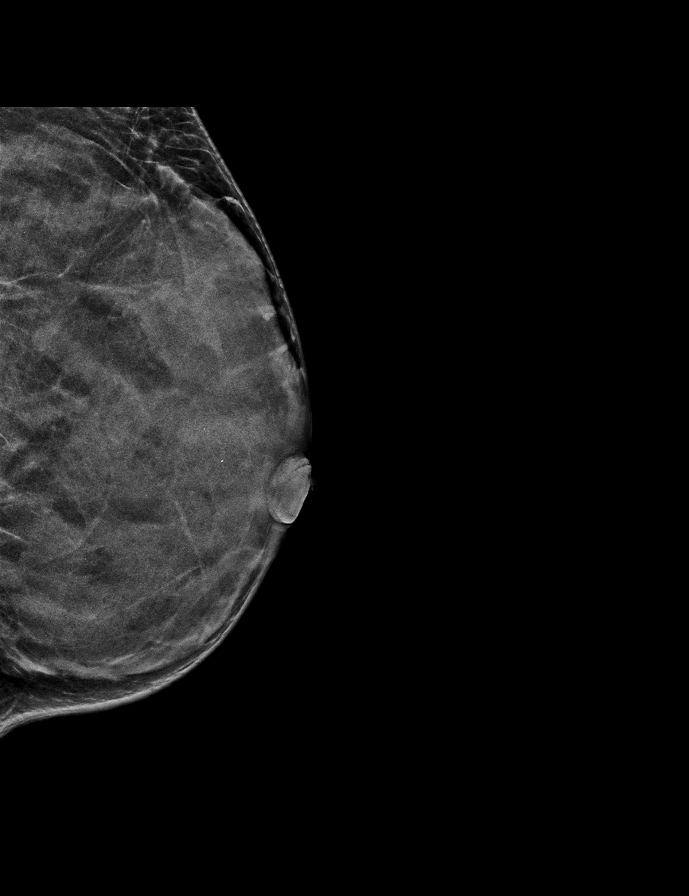

[R MLO synth-2D]
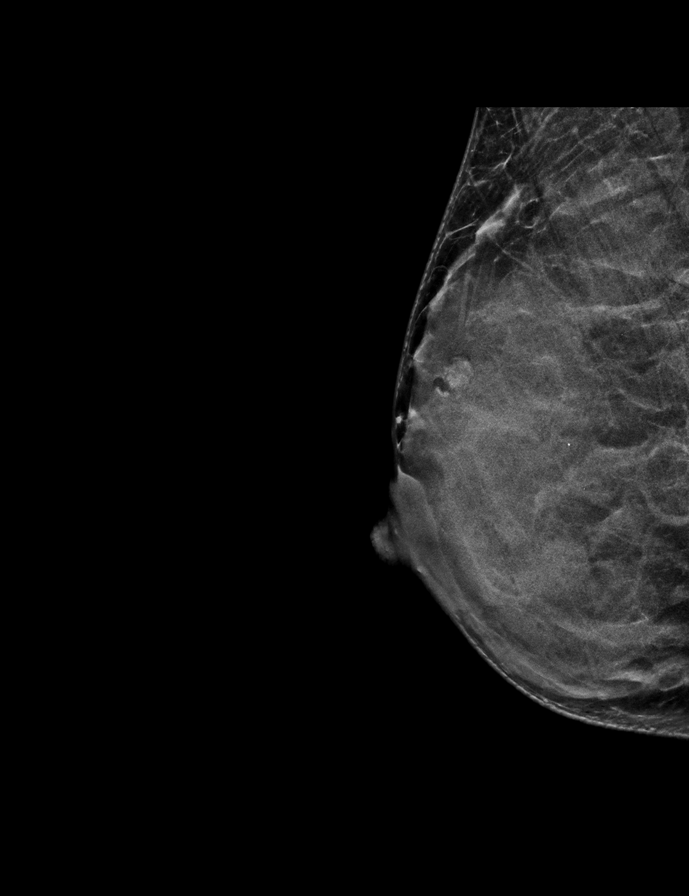

[R MLO tomo · tomo slice 23/44.0]
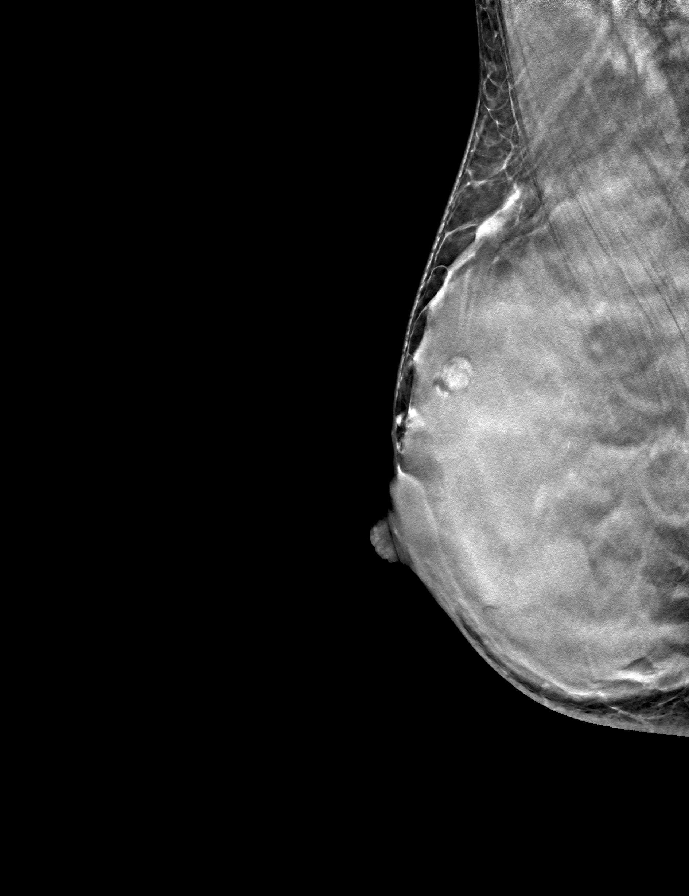

[L CC tomo · tomo slice 22/43.0]
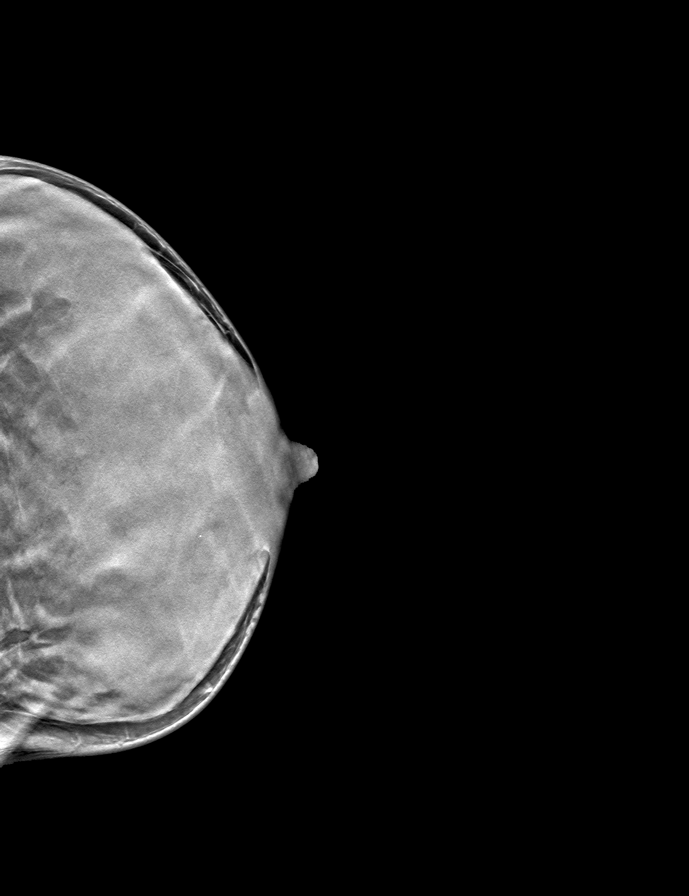

[L MLO tomo · tomo slice 23/44.0]
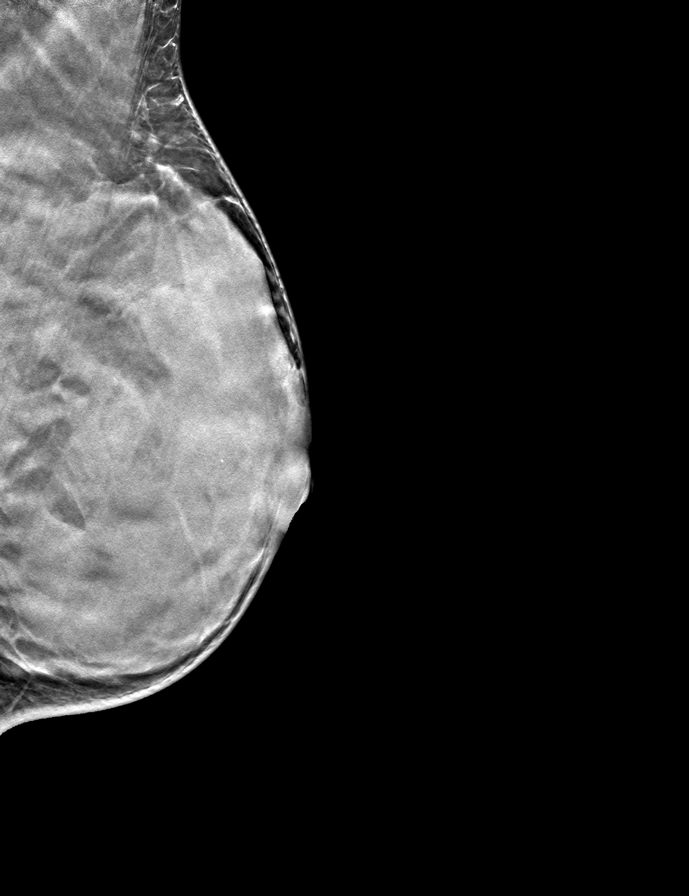

[R CC tomo · tomo slice 23/44.0]
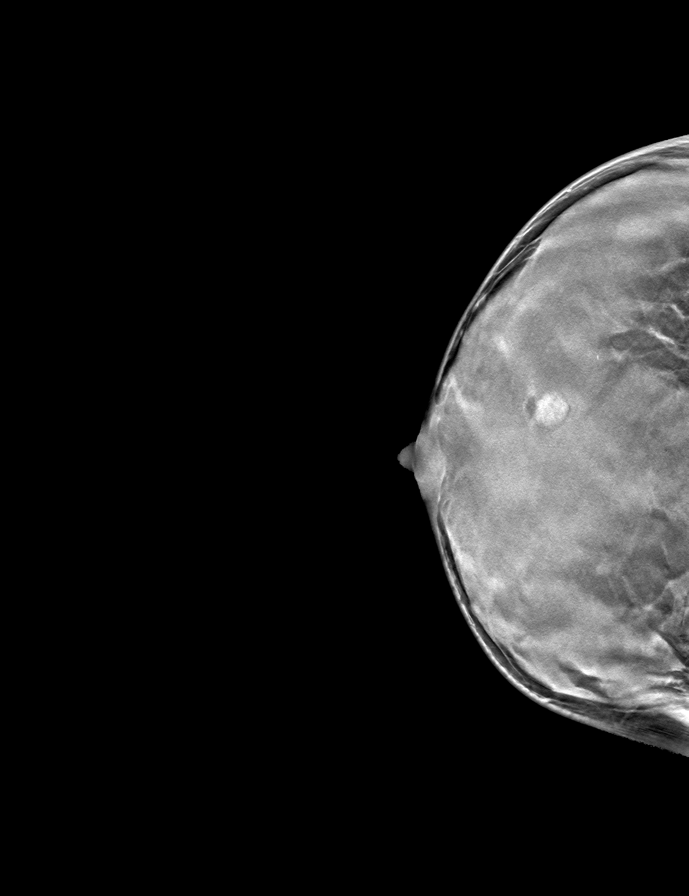

[8 of 24 positions shown; findings below may reference images not displayed]

ACR Breast Density Category d: The breast tissue is extremely dense,
which lowers the sensitivity of mammography.
FINDINGS: In the superior slightly lateral aspect of the right breast there is
a high density oval mass measuring 9 mm. There is a thin lucent rim
of fat surrounding the mass. There are no suspicious findings in the
upper-outer quadrant of the left breast to explain the patient's
breast pain. No suspicious calcifications, masses or areas of
distortion are seen in the bilateral breasts.

Mammographic images were processed with CAD.

Physical exam of the superior right breast demonstrates no discrete
palpable lumps. No palpable masses are identified in the upper-outer
quadrant of the left breast at the site of intermittent focal pain.
Tenderness is elicited adjacent to the nipple in the upper-outer
left breast.

Ultrasound targeted to the right breast at 12 o'clock, 1 cm from the
nipple demonstrates a predominantly hyperechoic oval circumscribed
mass measuring 1.2 x 0.7 x 0.9 cm. There is a small hypoechoic
portion within the mass. No evidence of right axillary
lymphadenopathy.

Ultrasound of the upper-outer left breast demonstrates normal
fibroglandular tissue. No masses or suspicious areas of shadowing
are identified.
IMPRESSION: 1. There are no mammographic or targeted sonographic abnormalities
in the upper-outer left breast at the site of intermittent focal
pain.

2. There is an indeterminate mass in the right breast at 12 o'clock.

3.  No evidence of right axillary lymphadenopathy.

RECOMMENDATION:
1. Ultrasound-guided aspiration versus biopsy of the mass in the
right breast at 12 o'clock is recommended. This could represent a
galactocele given the recent history of cessation of breastfeeding.
This procedure has been scheduled for [DATE] at [DATE] p.m..

2. Clinical follow-up recommended for the painful area of concern in
the upper-outer left breast. Any further workup should be based on
clinical grounds.

I have discussed the findings and recommendations with the patient.
Results were also provided in writing at the conclusion of the
visit. If applicable, a reminder letter will be sent to the patient
regarding the next appointment.

BI-RADS CATEGORY  4: Suspicious.

## 2018-04-12 ENCOUNTER — Ambulatory Visit
Admission: RE | Admit: 2018-04-12 | Discharge: 2018-04-12 | Disposition: A | Discharge status: Home / Self Care | Payer: Managed Care, Other (non HMO) | Source: Ambulatory Visit | Attending: Obstetrics and Gynecology | Admitting: Obstetrics and Gynecology

## 2018-04-12 ENCOUNTER — Other Ambulatory Visit: Payer: Managed Care, Other (non HMO)

## 2018-04-12 ENCOUNTER — Other Ambulatory Visit: Payer: Self-pay | Admitting: Obstetrics and Gynecology

## 2018-04-12 DIAGNOSIS — N6310 Unspecified lump in the right breast, unspecified quadrant: Secondary | ICD-10-CM

## 2018-04-12 HISTORY — PX: BREAST BIOPSY: SHX20

## 2018-04-12 IMAGING — MG MM CLIP PLACEMENT
1 series · 1 of 1 positions shown · non-contrast
Comparison: Previous exam(s).

CLINICAL DATA: Indeterminate right breast mass. Patient status post
ultrasound-guided biopsy.

EXAM:
DIAGNOSTIC RIGHT MAMMOGRAM POST ULTRASOUND BIOPSY

[R CC]
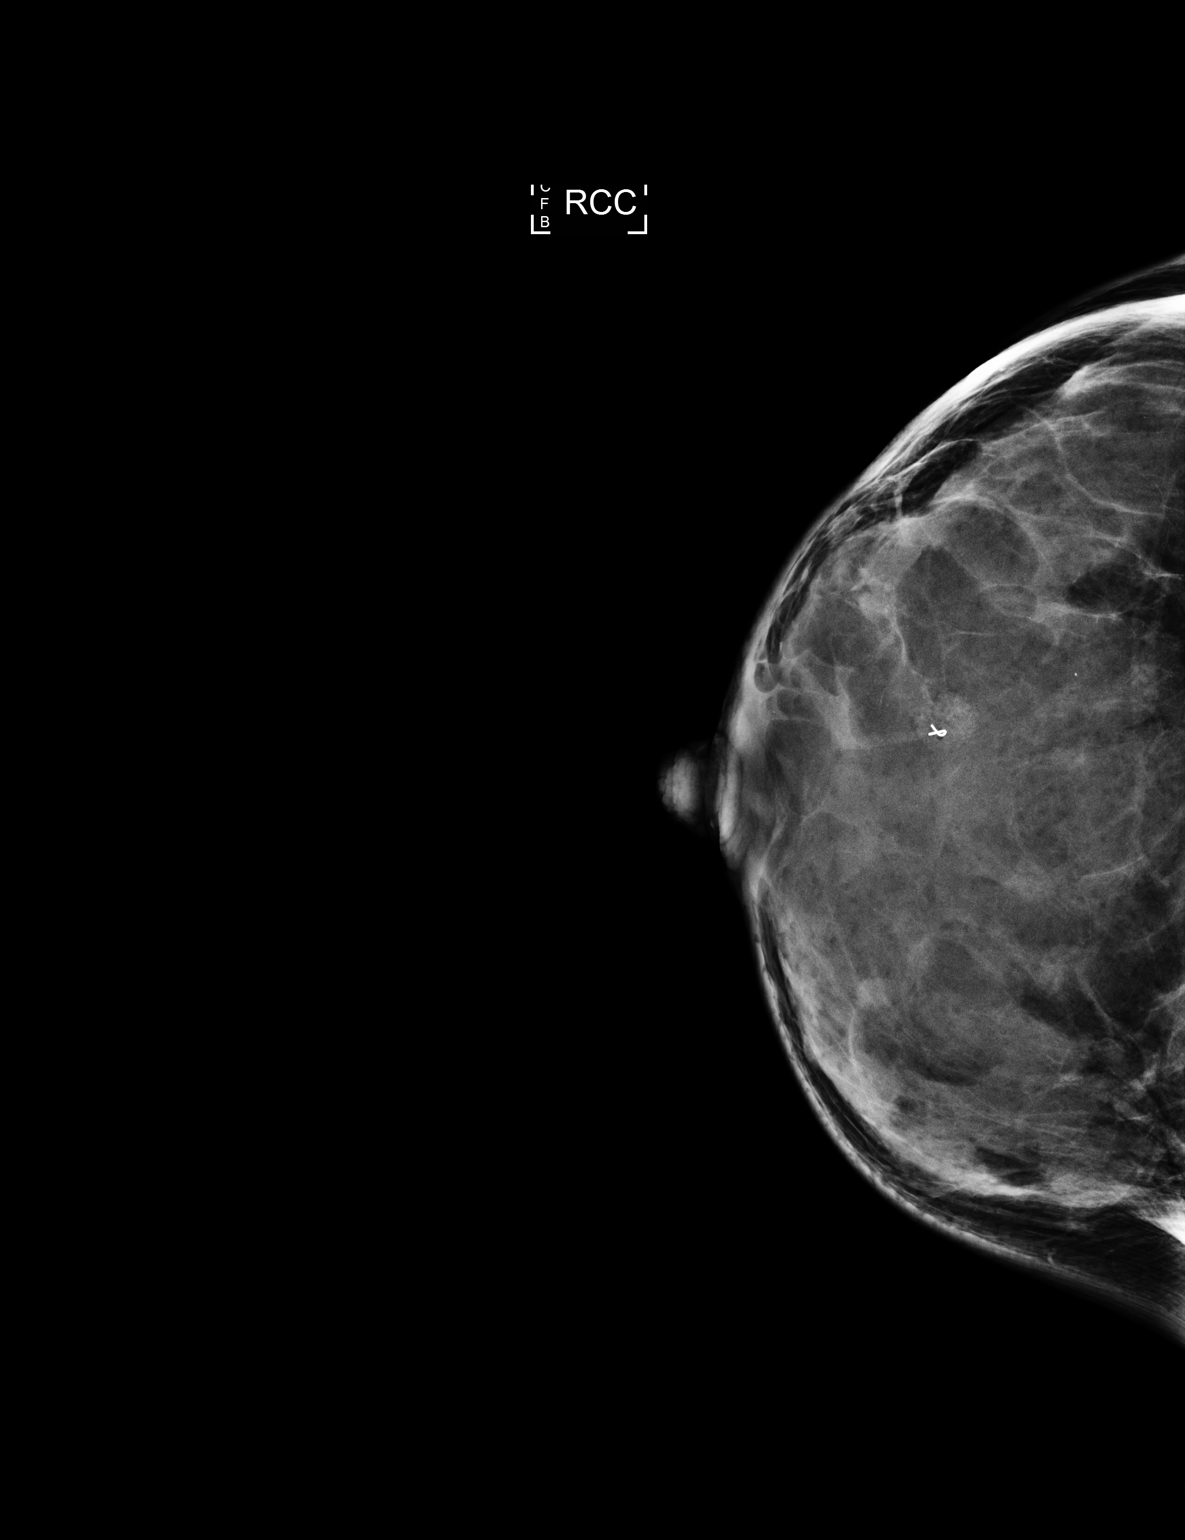

[1 of 1 positions shown; findings below may reference images not displayed]

FINDINGS: Mammographic images were obtained following ultrasound guided biopsy
of right breast mass 12 o'clock position. Ribbon shaped marking clip
in appropriate position.
IMPRESSION: Appropriate position ribbon shaped marking clip status post
ultrasound-guided biopsy right breast mass.

Final Assessment: Post Procedure Mammograms for Marker Placement

## 2018-04-12 IMAGING — US US BREAST BX W LOC DEV 1ST LESION IMG BX SPEC US GUIDE*R*
1 series · 5 of 5 positions shown · non-contrast
Comparison: Previous exam(s).

ADDENDUM:
Pathology revealed PSEUDOANGIOMATOUS STROMAL HYPERPLASIA (PASH),
FIBROCYSTIC CHANGE AND ADENOSIS WITH CALCIFICATIONS of RIGHT breast,
12 o'clock .

This was found to be concordant by Dr. AIKUINEN.
Pathology results were discussed with the patient by telephone. The
patient reported doing well after the biopsy with tenderness at the
site. Post biopsy instructions and care were reviewed and questions
were answered. The patient was encouraged to call The [REDACTED]
The patient was instructed to continue with monthly self breast
examinations, clinical follow-up as needed, and to return for annual
mammography at 41. The patient was informed a reminder notice would
be sent regarding this appointment.
Pathology results reported by AIKUINEN, RN on [DATE].
CLINICAL DATA: Patient with indeterminate right breast mass
EXAM:
ULTRASOUND GUIDED RIGHT BREAST CORE NEEDLE BIOPSY

[Series 1: us breast bx w loc dev 1st lesion img bx spec us g · 0.06mm/px · 5 of 5 slices shown]
[im 1/5]
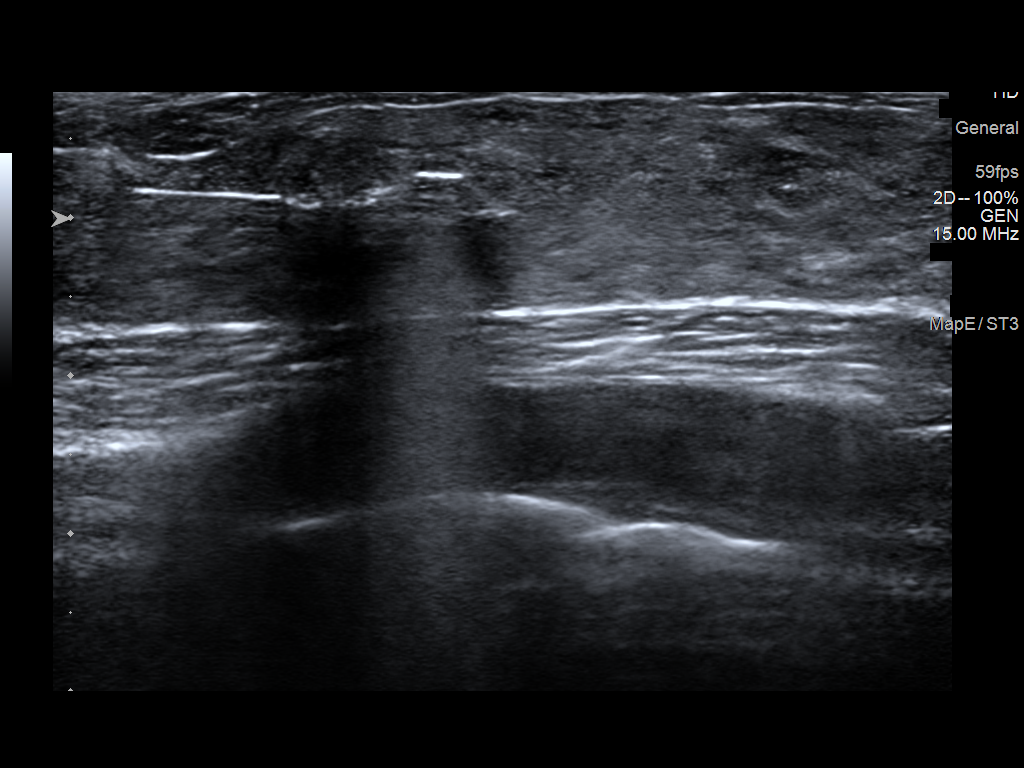
[im 2/5]
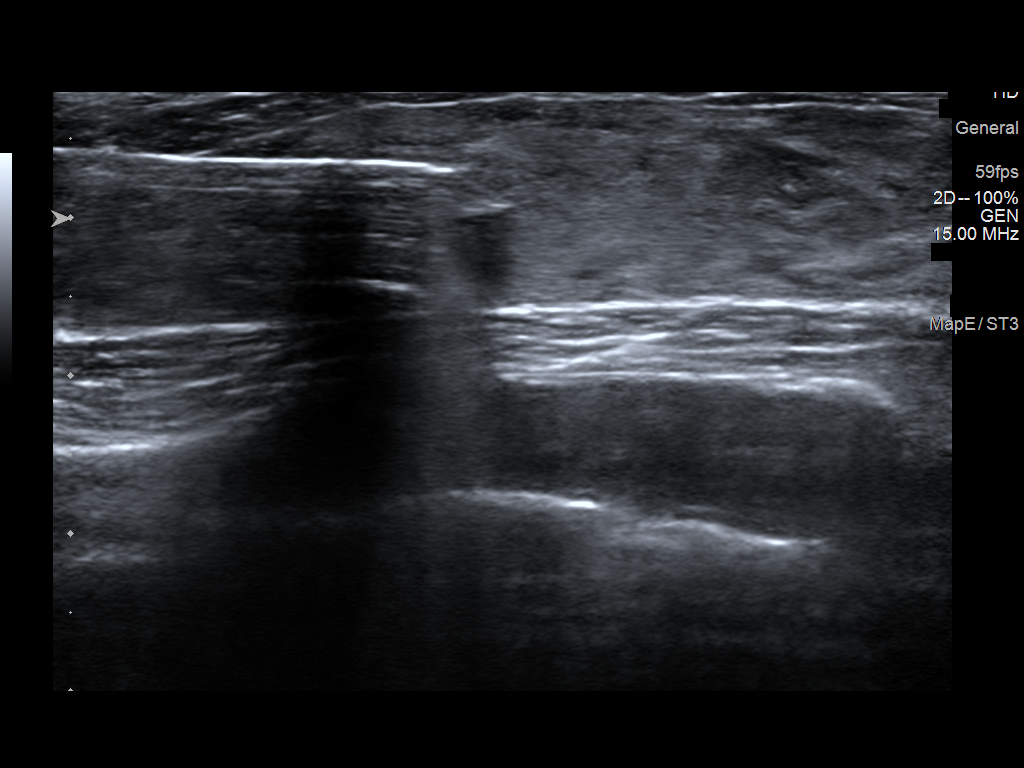
[im 3/5]
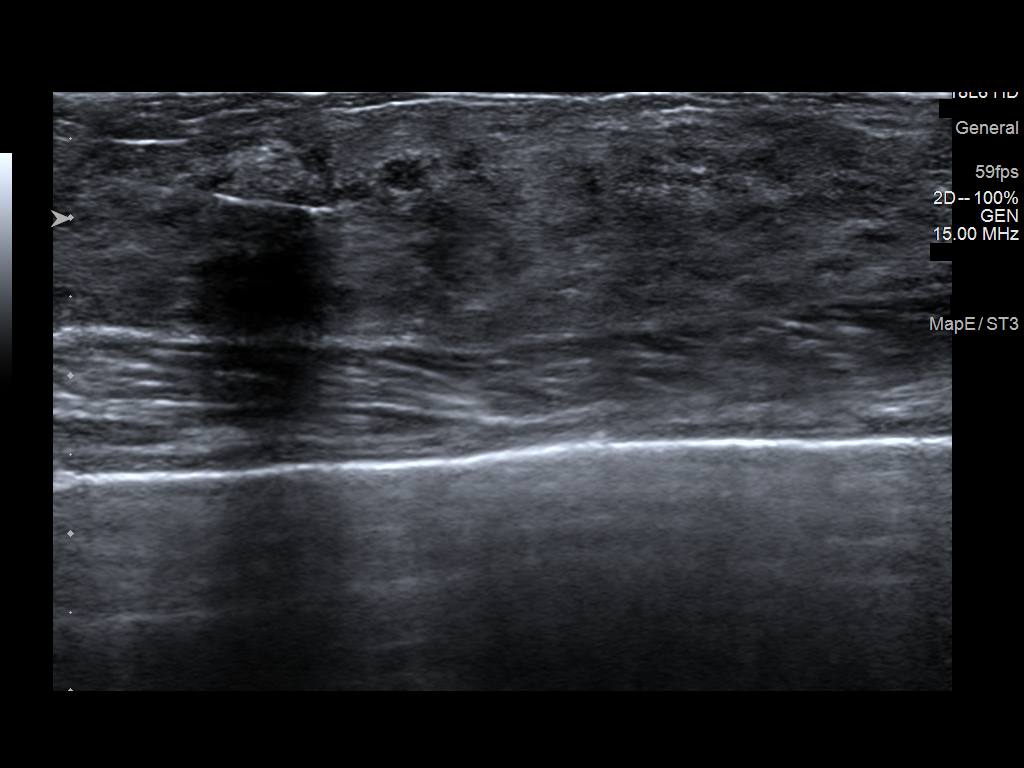
[im 4/5]
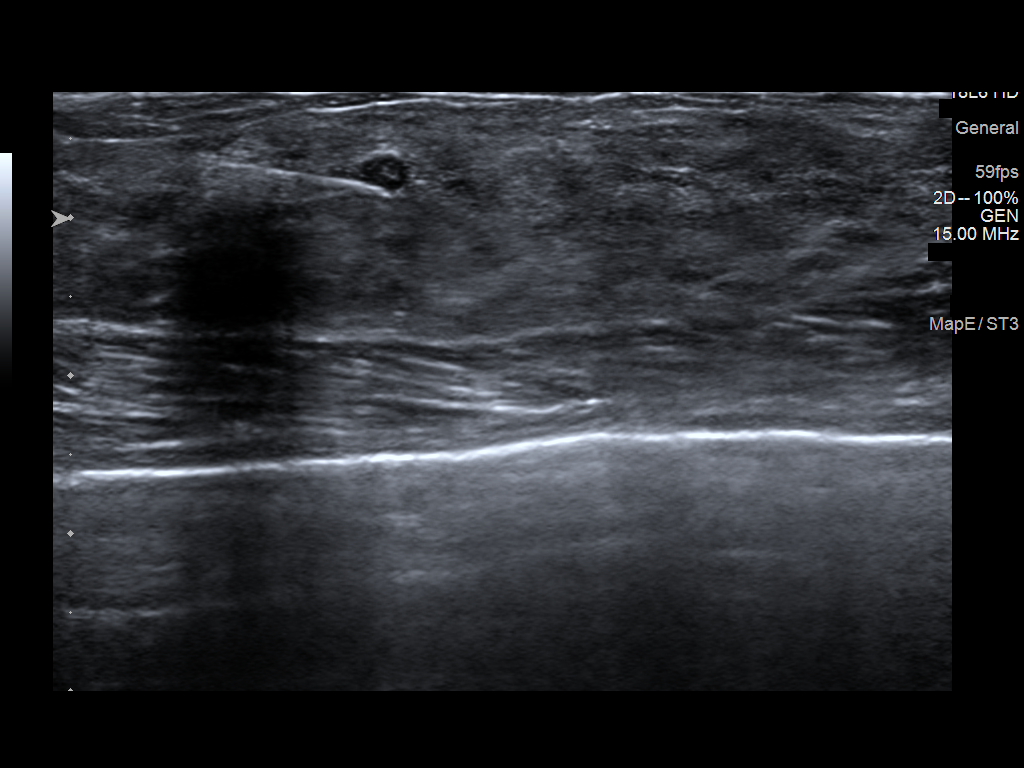
[im 5/5]
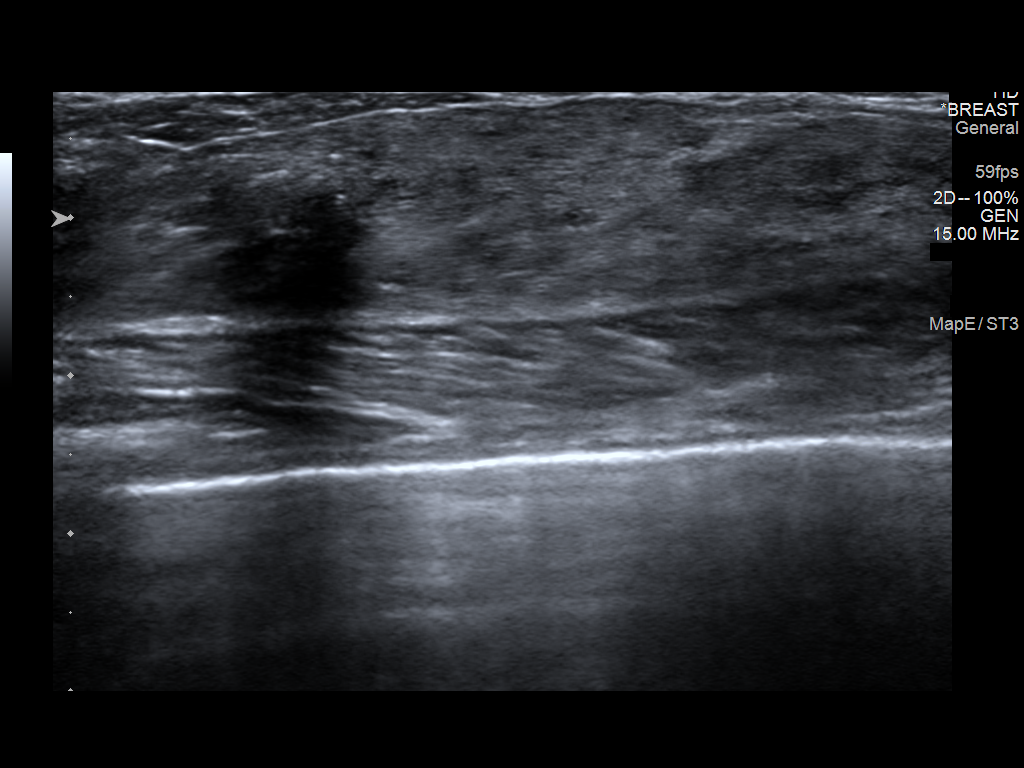

[5 of 5 positions shown; findings below may reference images not displayed]



Lesion quadrant: Upper outer quadrant

Using sterile technique and 1% Lidocaine as local anesthetic, under
direct ultrasound visualization, a 14 gauge AIKUINEN device was
used to perform biopsy of right breast mass 12 o'clock position
using a lateral approach. At the conclusion of the procedure a
ribbon shaped tissue marker clip was deployed into the biopsy
cavity. Follow up 2 view mammogram was performed and dictated
separately.
IMPRESSION: Ultrasound guided biopsy of right breast mass. No apparent
complications.

## 2018-04-18 MED FILL — DOXYCYCLINE HYCLATE 100 MG: 100 | 15 days supply | Qty: 15 | Fill #3 | Status: TO

## 2018-08-01 MED FILL — PROMETHAZINE 12.5 MG TABLET: 12.5 | 30 days supply | Qty: 90 | Fill #0

## 2018-08-07 MED FILL — predniSONE 10 MG TABS: 10 | 30 days supply | Qty: 30 | Fill #0

## 2018-08-07 MED FILL — AMPICILLIN TR 500 MG CAP: 500 | 30 days supply | Qty: 30 | Fill #0

## 2018-08-07 MED FILL — metroNIDAZOLE 0.75 % CREA: 0.75 | 20 days supply | Qty: 45 | Fill #0

## 2018-09-04 MED FILL — PROMETHAZINE 12.5 MG TABLET: 12.5 | 30 days supply | Qty: 90 | Fill #0

## 2018-09-14 MED FILL — AMPICILLIN TR 500 MG CAP: 500 | 30 days supply | Qty: 30 | Fill #1

## 2018-10-17 MED FILL — AMPICILLIN TR 500 MG CAP: 500 | 30 days supply | Qty: 30 | Fill #2

## 2018-11-03 ENCOUNTER — Encounter: Payer: Self-pay | Admitting: Internal Medicine

## 2018-11-03 NOTE — Progress Notes (Signed)
Abstracted and sent to scan  

## 2018-11-13 MED FILL — AMPICILLIN TR 500 MG CAP: 500 | 30 days supply | Qty: 30 | Fill #0

## 2018-12-18 MED FILL — AMPICILLIN TR 500 MG CAP: 500 | 30 days supply | Qty: 30 | Fill #1

## 2018-12-20 NOTE — L&D Delivery Note (Signed)
Delivery Note Pt progressed rapidly to complete dilation and pushed well.  At 1:53 PM a healthy female was delivered via  (Presentation: ROA).  APGAR:8, 9; weight pending .   Placenta status: delivered spontaneously.  Cord:  with the following complications:corporal x 1   Anesthesia:  epidural Episiotomy:  none Lacerations:  none Suture Repair: n/a Est. Blood Loss (mL):  275mL Mom to postpartum.  Baby to Couplet care / Skin to Skin.  Logan Bores 02/24/2019, 2:05 PM

## 2019-01-19 MED FILL — AMPICILLIN TR 500 MG CAP: 500 | 30 days supply | Qty: 30 | Fill #2

## 2019-02-19 MED FILL — predniSONE 10 MG TABS: 10 | 9 days supply | Qty: 18 | Fill #0

## 2019-02-19 MED FILL — hydrOXYzine HCL 25 MG TABS: 25 | 6 days supply | Qty: 20 | Fill #0

## 2019-02-21 DIAGNOSIS — L299 Pruritus, unspecified: Secondary | ICD-10-CM | POA: Insufficient documentation

## 2019-02-21 MED FILL — TRIAMCINOLONE ACETONIDE 0.5: 0.5 | 5 days supply | Qty: 15 | Fill #0

## 2019-02-23 ENCOUNTER — Other Ambulatory Visit: Payer: Self-pay | Admitting: Obstetrics and Gynecology

## 2019-02-23 ENCOUNTER — Other Ambulatory Visit: Payer: Self-pay | Admitting: Advanced Practice Midwife

## 2019-02-23 DIAGNOSIS — Z3480 Encounter for supervision of other normal pregnancy, unspecified trimester: Secondary | ICD-10-CM

## 2019-02-23 NOTE — H&P (Signed)
Suzanne Mclaughlin is a 38 y.o. female G2P1001 at 22 0/7 weeks (EDD 03/03/19 by LMP c/w 10 week [place])  presenting for IOL at term.   Prenatal care uncomplicated except AMA with low risk Panorama.  In last week developed PUPPS on abdomen which has made her miserable--on prednisone dose pack. .  OB History    Gravida  2   Para  1   Term  1   Preterm      AB  1   Living  1     SAB      TAB  1   Ectopic      Multiple  0   Live Births  1         2017 NSVD 7#14oz SAB x 1  Past Medical History:  Diagnosis Date  . Cancer ([place])    melanoma  . GERD (gastroesophageal reflux disease)   . Postpartum care following vaginal delivery (12/15) 12/03/2016  . Vaginal Pap smear, abnormal    Past Surgical History:  Procedure Laterality Date  . LEG SURGERY Right 07/2015   1a melanoma removal from lower leg  . NASAL SEPTUM SURGERY  2007  . TONSILLECTOMY AND ADENOIDECTOMY  1997   Family History: family history includes Cancer in her maternal aunt; Hyperlipidemia in her mother; Hypertension in her mother. Social History:  reports that she has never smoked. She has never used smokeless tobacco. She reports that she does not drink alcohol or use drugs.     Maternal Diabetes: No Genetic Screening: Normal Maternal Ultrasounds/Referrals: Abnormal:  Findings:   Isolated choroid plexus cyst Fetal Ultrasounds or other Referrals:  None Maternal Substance Abuse:  No Significant Maternal Medications:  Meds include: Other: recent prednisone Significant Maternal Lab Results:  None Other Comments:  None  Review of Systems  Constitutional: Negative for fever.  Gastrointestinal: Negative for abdominal pain.  Skin: Positive for itching and rash.   Maternal Medical History:  Contractions: Frequency: irregular.   Perceived severity is mild.    Fetal activity: Perceived fetal activity is normal.    Prenatal complications: PUPPS  Prenatal Complications - Diabetes: none.      unknown if  currently breastfeeding. Maternal Exam:  Uterine Assessment: Contraction strength is mild.  Contraction frequency is irregular.   Abdomen: Patient reports no abdominal tenderness. Fetal presentation: vertex  Introitus: Normal vulva. Normal vagina.  Pelvis: adequate for delivery.      Physical Exam  Constitutional: She appears well-developed.  Cardiovascular: Normal rate and regular rhythm.  Respiratory: Effort normal.  GI: Soft.  Erythematous papular rash across abdomen from pubis to above belly button, some darker areas c/w bruising   Genitourinary:    Vulva and vagina normal.   Neurological: She is alert.  Psychiatric: She has a normal mood and affect.    Prenatal labs: ABO, Rh:   AB positive Antibody:  negative Rubella:  Immune RPR:   NR HBsAg:   NEG HIV:   NR GBS:   neg Hgb AA One hour GCT 119 Panorama and AFP negative Essential panel negative  Assessment/Plan: Pt for IOL at term with favorable cervix.  PUPPS with minimal response to steroid dosepack.  Will try IV solumedrol while inpatient x 2 doses.   Plan Pitocin and  AROM   Logan Bores 02/23/2019, 5:27 PM

## 2019-02-24 ENCOUNTER — Encounter (HOSPITAL_COMMUNITY): Payer: Self-pay

## 2019-02-24 ENCOUNTER — Inpatient Hospital Stay (HOSPITAL_COMMUNITY): Payer: Managed Care, Other (non HMO) | Admitting: Anesthesiology

## 2019-02-24 ENCOUNTER — Other Ambulatory Visit: Payer: Self-pay

## 2019-02-24 ENCOUNTER — Inpatient Hospital Stay (HOSPITAL_COMMUNITY)
Admission: RE | Admit: 2019-02-24 | Discharge: 2019-02-26 | DRG: 807 | Disposition: A | Discharge status: Home / Self Care | Payer: Managed Care, Other (non HMO) | Attending: Obstetrics and Gynecology | Admitting: Obstetrics and Gynecology

## 2019-02-24 DIAGNOSIS — Z3A38 38 weeks gestation of pregnancy: Secondary | ICD-10-CM | POA: Diagnosis not present

## 2019-02-24 DIAGNOSIS — O26893 Other specified pregnancy related conditions, third trimester: Secondary | ICD-10-CM | POA: Diagnosis present

## 2019-02-24 DIAGNOSIS — O2686 Pruritic urticarial papules and plaques of pregnancy (PUPPP): Secondary | ICD-10-CM | POA: Diagnosis present

## 2019-02-24 LAB — CBC
HCT: 37.9 % (ref 36.0–46.0)
Hemoglobin: 12.6 g/dL (ref 12.0–15.0)
MCH: 30.4 pg (ref 26.0–34.0)
MCHC: 33.2 g/dL (ref 30.0–36.0)
MCV: 91.5 fL (ref 80.0–100.0)
PLATELETS: 218 10*3/uL (ref 150–400)
RBC: 4.14 MIL/uL (ref 3.87–5.11)
RDW: 13 % (ref 11.5–15.5)
WBC: 15 10*3/uL — ABNORMAL HIGH (ref 4.0–10.5)
nRBC: 0 % (ref 0.0–0.2)

## 2019-02-24 LAB — TYPE AND SCREEN
ABO/RH(D): AB POS
Antibody Screen: NEGATIVE

## 2019-02-24 LAB — RPR: RPR: NONREACTIVE

## 2019-02-24 MED ORDER — PHENYLEPHRINE 40 MCG/ML (10ML) SYRINGE FOR IV PUSH (FOR BLOOD PRESSURE SUPPORT): 80.0000 ug | PREFILLED_SYRINGE | INTRAVENOUS | Status: DC | PRN

## 2019-02-24 MED ORDER — ONDANSETRON HCL 4 MG/2ML IJ SOLN: 4.0000 mg | Freq: Four times a day (QID) | INTRAMUSCULAR | Status: DC | PRN

## 2019-02-24 MED ORDER — TETANUS-DIPHTH-ACELL PERTUSSIS 5-2.5-18.5 LF-MCG/0.5 IM SUSP: 0.5000 mL | Freq: Once | INTRAMUSCULAR | Status: DC

## 2019-02-24 MED ORDER — SOD CITRATE-CITRIC ACID 500-334 MG/5ML PO SOLN: 30.0000 mL | ORAL | Status: DC | PRN

## 2019-02-24 MED ORDER — EPHEDRINE 5 MG/ML INJ: 10.0000 mg | INTRAVENOUS | Status: DC | PRN

## 2019-02-24 MED ORDER — LACTATED RINGERS IV SOLN: 500.0000 mL | INTRAVENOUS | Status: DC | PRN

## 2019-02-24 MED ORDER — PREDNISONE 10 MG PO TABS
10.0000 mg | ORAL_TABLET | Freq: Every day | ORAL | Status: DC
  Administered 2019-02-25 – 2019-02-26 (×2): 10 mg via ORAL
  Filled 2019-02-24 (×2): qty 1

## 2019-02-24 MED ORDER — FENTANYL CITRATE (PF) 100 MCG/2ML IJ SOLN: 50.0000 ug | INTRAMUSCULAR | Status: DC | PRN

## 2019-02-24 MED ORDER — WITCH HAZEL-GLYCERIN EX PADS: 1.0000 "application " | MEDICATED_PAD | CUTANEOUS | Status: DC | PRN

## 2019-02-24 MED ORDER — DIBUCAINE 1 % RE OINT: 1.0000 "application " | TOPICAL_OINTMENT | RECTAL | Status: DC | PRN

## 2019-02-24 MED ORDER — LACTATED RINGERS IV SOLN: 500.0000 mL | Freq: Once | INTRAVENOUS | Status: DC

## 2019-02-24 MED ORDER — SODIUM CHLORIDE (PF) 0.9 % IJ SOLN
INTRAMUSCULAR | Status: DC | PRN
  Administered 2019-02-24: 12 mL/h via EPIDURAL

## 2019-02-24 MED ORDER — FENTANYL-BUPIVACAINE-NACL 0.5-0.125-0.9 MG/250ML-% EP SOLN
EPIDURAL | Status: AC
  Filled 2019-02-24: qty 250

## 2019-02-24 MED ORDER — OXYCODONE-ACETAMINOPHEN 5-325 MG PO TABS: 1.0000 | ORAL_TABLET | ORAL | Status: DC | PRN

## 2019-02-24 MED ORDER — ACETAMINOPHEN 325 MG PO TABS: 650.0000 mg | ORAL_TABLET | ORAL | Status: DC | PRN

## 2019-02-24 MED ORDER — ONDANSETRON HCL 4 MG/2ML IJ SOLN: 4.0000 mg | INTRAMUSCULAR | Status: DC | PRN

## 2019-02-24 MED ORDER — SIMETHICONE 80 MG PO CHEW: 80.0000 mg | CHEWABLE_TABLET | ORAL | Status: DC | PRN

## 2019-02-24 MED ORDER — FENTANYL-BUPIVACAINE-NACL 0.5-0.125-0.9 MG/250ML-% EP SOLN: 12.0000 mL/h | EPIDURAL | Status: DC | PRN

## 2019-02-24 MED ORDER — TERBUTALINE SULFATE 1 MG/ML IJ SOLN: 0.2500 mg | Freq: Once | INTRAMUSCULAR | Status: DC | PRN

## 2019-02-24 MED ORDER — LIDOCAINE HCL (PF) 1 % IJ SOLN
INTRAMUSCULAR | Status: DC | PRN
  Administered 2019-02-24 (×2): 6 mL via EPIDURAL

## 2019-02-24 MED ORDER — BENZOCAINE-MENTHOL 20-0.5 % EX AERO: 1.0000 "application " | INHALATION_SPRAY | CUTANEOUS | Status: DC | PRN

## 2019-02-24 MED ORDER — OXYTOCIN 40 UNITS IN NORMAL SALINE INFUSION - SIMPLE MED: 2.5000 [IU]/h | INTRAVENOUS | Status: DC

## 2019-02-24 MED ORDER — ZOLPIDEM TARTRATE 5 MG PO TABS: 5.0000 mg | ORAL_TABLET | Freq: Every evening | ORAL | Status: DC | PRN

## 2019-02-24 MED ORDER — IBUPROFEN 600 MG PO TABS
600.0000 mg | ORAL_TABLET | Freq: Four times a day (QID) | ORAL | Status: DC
  Administered 2019-02-24 – 2019-02-26 (×7): 600 mg via ORAL
  Filled 2019-02-24 (×9): qty 1

## 2019-02-24 MED ORDER — PHENYLEPHRINE 40 MCG/ML (10ML) SYRINGE FOR IV PUSH (FOR BLOOD PRESSURE SUPPORT)
PREFILLED_SYRINGE | INTRAVENOUS | Status: AC
  Filled 2019-02-24: qty 10

## 2019-02-24 MED ORDER — SENNOSIDES-DOCUSATE SODIUM 8.6-50 MG PO TABS
2.0000 | ORAL_TABLET | ORAL | Status: DC
  Administered 2019-02-24 – 2019-02-26 (×3): 2 via ORAL
  Filled 2019-02-24 (×3): qty 2

## 2019-02-24 MED ORDER — OXYTOCIN 40 UNITS IN NORMAL SALINE INFUSION - SIMPLE MED
1.0000 m[IU]/min | INTRAVENOUS | Status: DC
  Administered 2019-02-24: 2 m[IU]/min via INTRAVENOUS
  Filled 2019-02-24: qty 1000

## 2019-02-24 MED ORDER — LACTATED RINGERS IV SOLN
INTRAVENOUS | Status: DC
  Administered 2019-02-24: 08:00:00 via INTRAVENOUS

## 2019-02-24 MED ORDER — HYDROXYZINE HCL 25 MG PO TABS
50.0000 mg | ORAL_TABLET | Freq: Three times a day (TID) | ORAL | Status: DC | PRN
  Administered 2019-02-24: 50 mg via ORAL
  Filled 2019-02-24: qty 2

## 2019-02-24 MED ORDER — DIPHENHYDRAMINE HCL 50 MG/ML IJ SOLN: 12.5000 mg | INTRAMUSCULAR | Status: DC | PRN

## 2019-02-24 MED ORDER — OXYTOCIN BOLUS FROM INFUSION
500.0000 mL | Freq: Once | INTRAVENOUS | Status: AC
  Administered 2019-02-24: 500 mL via INTRAVENOUS

## 2019-02-24 MED ORDER — ONDANSETRON HCL 4 MG PO TABS: 4.0000 mg | ORAL_TABLET | ORAL | Status: DC | PRN

## 2019-02-24 MED ORDER — DIPHENHYDRAMINE HCL 25 MG PO CAPS: 25.0000 mg | ORAL_CAPSULE | Freq: Four times a day (QID) | ORAL | Status: DC | PRN

## 2019-02-24 MED ORDER — PRENATAL MULTIVITAMIN CH
1.0000 | ORAL_TABLET | Freq: Every day | ORAL | Status: DC
  Administered 2019-02-25 – 2019-02-26 (×2): 1 via ORAL
  Filled 2019-02-24 (×3): qty 1

## 2019-02-24 MED ORDER — COCONUT OIL OIL
1.0000 "application " | TOPICAL_OIL | Status: DC | PRN
  Administered 2019-02-24: 1 via TOPICAL

## 2019-02-24 MED ORDER — OXYCODONE-ACETAMINOPHEN 5-325 MG PO TABS: 2.0000 | ORAL_TABLET | ORAL | Status: DC | PRN

## 2019-02-24 MED ORDER — LIDOCAINE HCL (PF) 1 % IJ SOLN: 30.0000 mL | INTRAMUSCULAR | Status: DC | PRN

## 2019-02-24 NOTE — Lactation Note (Signed)
This note was copied from a baby's chart. Lactation Consultation Note  Patient Name: Suzanne Mclaughlin Date: 02/24/2019 Reason for consult: Initial assessment;Other (Comment);Term(AMA)  7 hours old FT female who is being exclusively BF by her mother, she's a P2 and experienced BF. Mom was able to provide breastmilk for her first baby but it was exclusively pumping and bottle, first baby never latched on, per mom she saw the lactation RN three times and it didn't work. However, she voiced that she had a great supply and had a freezer full of breastmilk, she would get between 8-9 ounces combined at a time once BF was fully established.  Mom has a [employer] DEBP at home.  Baby already nursing when entering the room; mom had her swaddled in a blanket; LC noticed that latch was shallow and asked mom if she would like to reposition baby to the breast. Mom agreed to have baby STS, once she got her off, noticed that mom's right nipple had a blister from the shallow latch. Explained mom the importance of having baby STS for adequate depth at the breast. Mom already familiar with hand expression, when [facility] assisted with hand expression colostrum was easily flowing off her nipples, LC rubbed it on her nipples, reviewed prevention and treatment for sore nipples. LC also requested coconut oil to the front desk, her RN will be bringing it in.  LC took baby STS to mom's opposite breast (left one) in cross cradle position and this time she latched with depth, audible swallows were noted. Showed mom key points for a deep latch, she voiced this feeding is much more comfortable, baby still nursing when exiting the room. Reviewed cluster feeding, feeding cues and normal newborn behavior.  Feeding plan:  1. Encouraged mom to feed baby STS 8-12 times/24 hours or sooner if feeding cues are present 2. Hand expression and spoon feeding were also encouraged 3. Mom will use her own colostrum as her # 1 tool to treat her  sore nipple and coconut oil for breast care  BF brochure and feeding diary were reviewed. Mom reported all questions and concerns were answered, she's aware of [place] services and will call PRN.  Maternal Data Formula Feeding for Exclusion: No Has patient been taught Hand Expression?: Yes Does the patient have breastfeeding experience prior to this delivery?: Yes  Feeding Feeding Type: Breast Fed  LATCH Score Latch: Grasps breast easily, tongue down, lips flanged, rhythmical sucking.  Audible Swallowing: A few with stimulation(with breast compressions)  Type of Nipple: Everted at rest and after stimulation  Comfort (Breast/Nipple): Soft / non-tender  Hold (Positioning): Assistance needed to correctly position infant at breast and maintain latch.  LATCH Score: 8  Interventions Interventions: Breast feeding basics reviewed;Assisted with latch;Skin to skin;Breast massage;Hand express;Breast compression;Coconut oil;Support pillows;Adjust position  Lactation Tools Discussed/Used Tools: Coconut oil WIC Program: No   Consult Status Consult Status: Follow-up Date: 02/25/19 Follow-up type: In-patient    Suzanne Mclaughlin 02/24/2019, 8:53 PM

## 2019-02-24 NOTE — Progress Notes (Signed)
Patient ID: Suzanne Mclaughlin, female   DOB: 01-18-1981, 38 y.o.   MRN: [id number] Pt feeling minimal cramping  afeb VSS FHR category 1   Cervix 3+/60/-2  AROM clear  Pitocin per protocol Epidural prn PUPPS rash still slightly expanding, but slowly on dose pack--will give IV dose solumedrol after delivery

## 2019-02-24 NOTE — Progress Notes (Signed)
Patient ID: Suzanne Mclaughlin, female   DOB: [birth year], 38 y.o.   MRN: [id number] Pt comfortable with epidural   afeb VSS  FHR Category 1   Cervix 90/5/-1  IUPC placed to optimize pitocin Follow progress

## 2019-02-24 NOTE — Anesthesia Preprocedure Evaluation (Signed)
Anesthesia Evaluation  Patient identified by MRN, date of birth, ID band Patient awake    Reviewed: Allergy & Precautions, H&P , NPO status , Patient's Chart, lab work & pertinent test results  Airway Mallampati: II  TM Distance: >3 FB Neck ROM: full    Dental no notable dental hx.    Pulmonary neg pulmonary ROS,    Pulmonary exam normal breath sounds clear to auscultation       Cardiovascular negative cardio ROS   Rhythm:regular Rate:Normal     Neuro/Psych negative neurological ROS  negative psych ROS   GI/Hepatic negative GI ROS, Neg liver ROS,   Endo/Other  negative endocrine ROS  Renal/GU negative Renal ROS     Musculoskeletal   Abdominal Normal abdominal exam  (+)   Peds  Hematology negative hematology ROS (+)   Anesthesia Other Findings   Reproductive/Obstetrics (+) Pregnancy                             Anesthesia Physical Anesthesia Plan  ASA: II  Anesthesia Plan: Epidural   Post-op Pain Management:    Induction:   PONV Risk Score and Plan:   Airway Management Planned:   Additional Equipment:   Intra-op Plan:   Post-operative Plan:   Informed Consent: I have reviewed the patients History and Physical, chart, labs and discussed the procedure including the risks, benefits and alternatives for the proposed anesthesia with the patient or authorized representative who has indicated his/her understanding and acceptance.       Plan Discussed with:   Anesthesia Plan Comments:         Anesthesia Quick Evaluation

## 2019-02-24 NOTE — Anesthesia Procedure Notes (Signed)
Epidural Patient location during procedure: OB Start time: 02/24/2019 10:18 AM End time: 02/24/2019 10:21 AM  Staffing Anesthesiologist: Lyn Hollingshead, MD Performed: anesthesiologist   Preanesthetic Checklist Completed: patient identified, site marked, surgical consent, pre-op evaluation, timeout performed, IV checked, risks and benefits discussed and monitors and equipment checked  Epidural Patient position: sitting Prep: site prepped and draped and DuraPrep Patient monitoring: continuous pulse ox and blood pressure Approach: midline Location: L3-L4 Injection technique: LOR air  Needle:  Needle type: Tuohy  Needle gauge: 17 G Needle length: 9 cm and 9 Needle insertion depth: 6 cm Catheter type: closed end flexible Catheter size: 19 Gauge Catheter at skin depth: 11 cm Test dose: negative and Other  Assessment Sensory level: T10 Events: blood not aspirated, injection not painful, no injection resistance, negative IV test and no paresthesia

## 2019-02-25 LAB — CBC
HCT: 33.2 % — ABNORMAL LOW (ref 36.0–46.0)
Hemoglobin: 10.9 g/dL — ABNORMAL LOW (ref 12.0–15.0)
MCH: 29.7 pg (ref 26.0–34.0)
MCHC: 32.8 g/dL (ref 30.0–36.0)
MCV: 90.5 fL (ref 80.0–100.0)
NRBC: 0 % (ref 0.0–0.2)
PLATELETS: 185 10*3/uL (ref 150–400)
RBC: 3.67 MIL/uL — ABNORMAL LOW (ref 3.87–5.11)
RDW: 12.8 % (ref 11.5–15.5)
WBC: 15 10*3/uL — AB (ref 4.0–10.5)

## 2019-02-25 LAB — ABO/RH: ABO/RH(D): AB POS

## 2019-02-25 NOTE — Progress Notes (Signed)
Mother requesting DEBP. States she was only able to pump with her last baby and would like to pump after feeding to help encourage milk production. DEBP set up and pt instructed in the correct clean of parts. Pt verbalizes understanding.

## 2019-02-25 NOTE — Lactation Note (Signed)
This note was copied from a baby's chart. Lactation Consultation Note  Patient Name: Suzanne Mclaughlin [id number] Date: 02/25/2019 Reason for consult: Initial assessment;Other (Comment);Infant weight loss;Term;Nipple pain/trauma(AMA)  16 hours old FT female who is being exclusively BF by her mother, she's a P2. Mom called for [facility] assistance, per mom feedings at the breast are getting better now, and her right nipple looked much better today, it's healing. Baby was already nursing when entering the room and mom wanted to check on the latch, baby had a big wide mouth with flanged lips and she was sucking in a rhythmical pattern with long movements of her jaw. Only a couple of swallows heard towards the end of the feeding, baby was falling asleep (after LC came in the room) but mom has plenty of colostrum, she has also been syringe feeding baby.   Mom's left nipple where baby just feed showed some redness and mild discomfort per mom, but no further signs of trauma. Instructed mom to keep using her colostrum and coconut oil, she'll also add breast shells to her plan to speed the healing process. Mom asked if she could latch baby on to the other breast while LC is in the room, but MD walked in and mom said she'll just try to BF later after MD is done with baby. Asked mom to call for assistance when needed; baby is starting to cluster feed.  Feeding plan:  1. Encouraged mom to feed baby STS 8-12 times/24 hours or sooner if feeding cues are present 2. Hand expression and spoon feeding were also encouraged 3. Mom will continue using her own colostrum as her # 1 tool to treat her sore nipple and coconut oil for breast care 4. She will start wearing her breast shells today, daytime only  Mom reported all questions and concerns were answered, she's aware of [place] services and will call PRN.  Maternal Data    Feeding Feeding Type: Breast Fed  LATCH Score Latch: Grasps breast easily, tongue down, lips  flanged, rhythmical sucking.  Audible Swallowing: A few with stimulation  Type of Nipple: Everted at rest and after stimulation  Comfort (Breast/Nipple): Filling, red/small blisters or bruises, mild/mod discomfort  Hold (Positioning): No assistance needed to correctly position infant at breast.  LATCH Score: 8  Interventions Interventions: Breast feeding basics reviewed;Assisted with latch;Skin to skin;Hand express;Breast compression;Adjust position;Shells;Support pillows  Lactation Tools Discussed/Used Tools: Shells Shell Type: Inverted   Consult Status Consult Status: Follow-up Date: 02/26/19 Follow-up type: In-patient    Stephinie Battisti Francene Boyers 02/25/2019, 4:58 PM

## 2019-02-25 NOTE — Progress Notes (Signed)
Post Partum Day 1 Subjective: no complaints, up ad lib and tolerating PO  Objective: Blood pressure (!) 95/55, pulse 68, temperature 97.7 F (36.5 C), temperature source Oral, resp. rate 18, height 5\' 5"  (1.651 m), weight 73.5 kg, last menstrual period 03/19/2018, SpO2 100 %, unknown if currently breastfeeding.  Physical Exam:  General: alert and cooperative Lochia: appropriate Uterine Fundus: firm PUPPS rash about the same, from pubis to 7-8IO above umbilicus R>L erythematous and papular  Recent Labs    02/24/19 0749 02/25/19 0436  HGB 12.6 10.9*  HCT 37.9 33.2*    Assessment/Plan: Discharge home tomorrow Continue prednisone and vistaril for PUPPS, states is not as bothersome, so declines IV solumedrol.    LOS: 1 day   Logan Bores 02/25/2019, 10:59 AM

## 2019-02-25 NOTE — Anesthesia Postprocedure Evaluation (Signed)
Anesthesia Post Note  Patient: Suzanne Mclaughlin  Procedure(s) Performed: AN AD HOC LABOR EPIDURAL     Patient location during evaluation: Mother Baby Anesthesia Type: Epidural Level of consciousness: awake and alert and oriented Pain management: satisfactory to patient Vital Signs Assessment: post-procedure vital signs reviewed and stable Respiratory status: respiratory function stable Cardiovascular status: stable Postop Assessment: no headache, no backache, epidural receding, patient able to bend at knees, no signs of nausea or vomiting and adequate PO intake Anesthetic complications: no    Last Vitals:  Vitals:   02/24/19 2352 02/25/19 0500  BP: 97/62 (!) 95/55  Pulse: (!) 58 68  Resp: 16 18  Temp: 36.5 C 36.5 C  SpO2:      Last Pain:  Vitals:   02/25/19 0557  TempSrc:   PainSc: 0-No pain   Pain Goal:                   Shar Paez

## 2019-02-25 NOTE — Lactation Note (Signed)
This note was copied from a baby's chart. Lactation Consultation Note  Patient Name: Suzanne Mclaughlin [id number] Date: 02/25/2019   Family resting.  Mother requested Lactation to visit later.      Maternal Data    Feeding    LATCH Score                   Interventions    Lactation Tools Discussed/Used     Consult Status      Carlye Grippe 02/25/2019, 2:38 PM

## 2019-02-26 ENCOUNTER — Encounter (HOSPITAL_COMMUNITY): Payer: Self-pay

## 2019-02-26 MED ORDER — IBUPROFEN 600 MG PO TABS: 600.0000 mg | ORAL_TABLET | Freq: Four times a day (QID) | ORAL | 0 refills | Status: DC

## 2019-02-26 MED FILL — IBUPROFEN 600 MG TABLET: 600 | 7 days supply | Qty: 30 | Fill #0

## 2019-02-26 NOTE — Discharge Instructions (Signed)
As per discharge pamphlet °

## 2019-02-26 NOTE — Discharge Summary (Signed)
OB Discharge Summary     Patient Name: Suzanne Mclaughlin DOB: 1981/01/05 MRN: [id number]  Date of admission: 02/24/2019 Delivering MD: Paula Compton   Date of discharge: 02/26/2019  Admitting diagnosis: induction  Intrauterine pregnancy: Unknown     Secondary diagnosis:  Active Problems:   Indication for care in labor and delivery, antepartum   NSVD (normal spontaneous vaginal delivery)  Additional problems: PUPPS     Discharge diagnosis: Term Pregnancy Delivered and [facility] course:  Induction of Labor With Vaginal Delivery   38 y.o. yo G3P1011 at Unknown was admitted to the hospital 02/24/2019 for induction of labor.  Indication for induction: Favorable cervix at term and PUPPs.  Patient had an uncomplicated labor course as follows: Membrane Rupture Time/Date: 8:30 AM ,02/24/2019   Intrapartum Procedures: Episiotomy: None [1]                                         Lacerations:  None [1]  Patient had delivery of a Viable infant.  Information for the patient's newborn:  Maham, Quintin [[id number]]  Delivery Method: Vaginal, Spontaneous(Filed from Delivery Summary)   02/24/2019  Details of delivery can be found in separate delivery note.  Patient had a routine postpartum course. Patient is discharged home 02/26/19.  Physical exam  Vitals:   02/25/19 0500 02/25/19 1421 02/25/19 2219 02/26/19 0609  BP: (!) 95/55 (!) 86/53 113/67 96/68  Pulse: 68 64 60 67  Resp: 18 20  18   Temp: 97.7 F (36.5 C) 98.2 F (36.8 C) 98 F (36.7 C) 98 F (36.7 C)  TempSrc: Oral Oral Oral   SpO2:  100% 100%   Weight:      Height:       General: alert Lochia: appropriate Uterine Fundus: firm  Labs: Lab Results  Component Value Date   WBC 15.0 (H) 02/25/2019   HGB 10.9 (L) 02/25/2019   HCT 33.2 (L) 02/25/2019   MCV 90.5 02/25/2019   PLT 185 02/25/2019   CMP Latest Ref Rng & Units  04/11/2017  Glucose 70 - 99 mg/dL 84  BUN 6 - 23 mg/dL 15  Creatinine 0.40 - 1.20 mg/dL 0.76  Sodium 135 - 145 mEq/L 140  Potassium 3.5 - 5.1 mEq/L 4.2  Chloride 96 - 112 mEq/L 103  CO2 19 - 32 mEq/L 27  Calcium 8.4 - 10.5 mg/dL 9.6  Total Protein 6.0 - 8.3 g/dL 7.4  Total Bilirubin 0.2 - 1.2 mg/dL 0.4  Alkaline Phos 39 - 117 U/L 72  AST 0 - 37 U/L 17  ALT 0 - 35 U/L 13    Discharge instruction: per After Visit Summary and "Baby and Me Booklet".  After visit meds:  Allergies as of 02/26/2019   No Known Allergies     Medication List    STOP taking these medications  ampicillin 500 MG capsule Commonly known as:  PRINCIPEN     TAKE these medications   hydrOXYzine 25 MG tablet Commonly known as:  ATARAX/VISTARIL Take 25 mg by mouth every 8 (eight) hours as needed for itching.   ibuprofen 600 MG tablet Commonly known as:  ADVIL,MOTRIN Take 1 tablet (600 mg total) by mouth every 6 (six) hours.   predniSONE 10 MG tablet Commonly known as:  DELTASONE Taper as follows: 4-4-3-3-2-2-1-1 What changed:    how much to take  how to take this  when to take this   prenatal multivitamin Tabs tablet Take 1 tablet by mouth daily at 12 noon.       Diet: routine diet  Activity: Advance as tolerated. Pelvic rest for 6 weeks.   Outpatient follow up:6 weeks  Newborn Data: Live born female  Birth Weight: 7 lb 7.8 oz (3396 g) APGAR: 69, 9  Newborn Delivery   Birth date/time:  02/24/2019 13:53:00 Delivery type:  Vaginal, Spontaneous     Baby Feeding: Breast Disposition:home with mother   02/26/2019 Clarene Duke, MD

## 2019-02-26 NOTE — Progress Notes (Signed)
PPD #2 Doing well, still with rash on abdomen Afeb, VSS Fundus firm D/c home

## 2019-02-26 NOTE — Lactation Note (Signed)
This note was copied from a baby's chart. Lactation Consultation Note  Patient Name: Suzanne Mclaughlin UDTHY'H Date: 02/26/2019 Reason for consult: Follow-up assessment;Nipple pain/trauma Baby is 43 hours old/7% weight loss.  Mom c/o sore nipples.  She took a break from latching baby last night.  Baby had two formula feeds.  Baby is currently on left breast in football hold.  Mom comfortable with feeding.  Latch is deep but repositioned baby closer to breast.  Nipple slightly pinched when baby came off.  Mom has large nipples.  Small abrasions and blisters noted bilaterally.  Mom is using EBM, coconut oil after feedings.  She does not like comfort gels.  Assisted with positioning baby to opposite breast.  Instructed on correct hand placement and technique for deep latch.  Baby latched easily and well.  Mom's breasts are becoming full.  Engorgement prevention and treatment reviewed.  Lactation outpatient services and support reviewed and encouraged prn.  Maternal Data    Feeding Feeding Type: Breast Fed  LATCH Score Latch: Repeated attempts needed to sustain latch, nipple held in mouth throughout feeding, stimulation needed to elicit sucking reflex.  Audible Swallowing: Spontaneous and intermittent  Type of Nipple: Everted at rest and after stimulation  Comfort (Breast/Nipple): Engorged, cracked, bleeding, large blisters, severe discomfort  Hold (Positioning): Assistance needed to correctly position infant at breast and maintain latch.  LATCH Score: 6  Interventions    Lactation Tools Discussed/Used Shell Type: Inverted   Consult Status Consult Status: Complete Follow-up type: Call as needed    Ave Filter 02/26/2019, 9:35 AM

## 2019-02-27 ENCOUNTER — Ambulatory Visit: Payer: Self-pay

## 2019-02-27 NOTE — Lactation Note (Signed)
This note was copied from a baby's chart. Lactation Consultation Note Mom called for LC d/t nipple pain, painful latch, engorgement, and supplementing. Mom states engorged. Has to pump prior to latching, then nipples are to large for baby to latch and to painful. Stopped mom pumping, let nipples rest for about 7 min. Latched baby. Mom grimacing the whole time baby suckling. Nipples was tight fit in baby's mouth. Explained to mom that sometimes baby's need to grow into mom's nipple, baby needs to get a little bigger. Encouraged mom for next feeding, do not pump first. Put the baby to the breast first. Mom has blisters on tips of nipples. From feeding prior to engorgement.  Mom stated she exclusively pumped with her first child for 1 yr and her nipples never went back down, they stayed large.  Encouraged to apply coconut oil when pumping, and comfort gels after BF. Gave mom information sheet on how much the baby needs if not going to the breast, just taking BM. Mom asked for nipples. States the curve tip syring is getting old and she feels the baby may need them for a little while. Discussed milk storage. Asked RN to give mom milk labels.  Noted improvement after mom finished pumping. Encouraged mom to lay flat w/ICE on chest. LC filled ice bags, gave to mom. Reminded mom of [place] OP services and resources.  Patient Name: Suzanne Mclaughlin GURKY'H Date: 02/27/2019 Reason for consult: Nipple pain/trauma;Engorgement;Mother's request;Difficult latch   Maternal Data    Feeding Feeding Type: Breast Milk  LATCH Score Latch: Grasps breast easily, tongue down, lips flanged, rhythmical sucking.  Audible Swallowing: Spontaneous and intermittent  Type of Nipple: Everted at rest and after stimulation  Comfort (Breast/Nipple): Engorged, cracked, bleeding, large blisters, severe discomfort  Hold (Positioning): Assistance needed to correctly position infant at breast and maintain latch.  LATCH Score:  7  Interventions Interventions: DEBP;Support pillows;Ice;Assisted with latch;Breast massage;Coconut oil;Comfort gels;Breast compression;Reverse pressure  Lactation Tools Discussed/Used Tools: Pump;Coconut oil;Comfort gels Shell Type: Inverted Breast pump type: Double-Electric Breast Pump Pump Review: Milk Storage Initiated by:: RN   Consult Status Consult Status: Complete Date: 02/27/19    Suzanne Mclaughlin 02/27/2019, 3:57 AM

## 2019-02-28 MED FILL — TRIAMCINOLONE ACETONIDE 0.5: 0.5 | 7 days supply | Qty: 15 | Fill #0

## 2019-02-28 MED FILL — predniSONE 10 MG TABS: 10 | 9 days supply | Qty: 18 | Fill #0

## 2019-02-28 MED FILL — hydrOXYzine HCL 25 MG TABS: 25 | 6 days supply | Qty: 20 | Fill #0

## 2019-03-01 MED FILL — predniSONE 10 MG TABS: 10 | 16 days supply | Qty: 40 | Fill #0

## 2019-03-04 ENCOUNTER — Inpatient Hospital Stay (HOSPITAL_COMMUNITY)
Admission: AD | Admit: 2019-03-04 | Payer: Managed Care, Other (non HMO) | Source: Ambulatory Visit | Admitting: Obstetrics and Gynecology

## 2019-03-19 MED FILL — predniSONE 10 MG TABS: 10 | 14 days supply | Qty: 14 | Fill #0

## 2019-03-22 MED FILL — predniSONE 20 MG TABS: 20 | 19 days supply | Qty: 36 | Fill #0

## 2019-04-11 MED FILL — AMPICILLIN TR 500 MG CAP: 500 | 30 days supply | Qty: 30 | Fill #3

## 2019-05-16 MED FILL — AMPICILLIN TR 500 MG CAP: 500 | 30 days supply | Qty: 30 | Fill #0

## 2019-10-11 ENCOUNTER — Encounter (INDEPENDENT_AMBULATORY_CARE_PROVIDER_SITE_OTHER): Payer: Self-pay

## 2019-11-20 MED FILL — AMPICILLIN TR 500 MG CAP: 500 | 30 days supply | Qty: 30 | Fill #1

## 2019-12-31 MED FILL — AMPICILLIN TR 500 MG CAP: 500 | 30 days supply | Qty: 30 | Fill #2

## 2020-01-17 ENCOUNTER — Ambulatory Visit: Payer: Managed Care, Other (non HMO)

## 2020-01-24 ENCOUNTER — Ambulatory Visit: Payer: Managed Care, Other (non HMO)

## 2020-02-03 ENCOUNTER — Ambulatory Visit: Payer: Managed Care, Other (non HMO)

## 2020-02-12 MED FILL — AMPICILLIN TR 500 MG CAP: 500 | 30 days supply | Qty: 30 | Fill #0

## 2020-03-13 MED FILL — AMPICILLIN TR 500 MG CAP: 500 | 10 days supply | Qty: 10 | Fill #3

## 2020-03-18 MED FILL — MYORISAN 30 MG CAPSULE: 30 | 30 days supply | Qty: 30 | Fill #0

## 2020-04-16 MED FILL — TRIAMCINOLONE 0.1% OINTMENT: 0.1 | 7 days supply | Qty: 15 | Fill #0

## 2020-04-17 MED FILL — MYORISAN 30 MG CAPSULE: 30 | 30 days supply | Qty: 30 | Fill #0

## 2020-05-15 MED FILL — metroNIDAZOLE 0.75 % CREA: 0.75 | 30 days supply | Qty: 45 | Fill #0

## 2020-05-15 MED FILL — MYORISAN 40 MG CAPSULE: 40 | 30 days supply | Qty: 30 | Fill #0

## 2020-06-18 MED FILL — MYORISAN 30 MG CAPSULE: 30 | 30 days supply | Qty: 60 | Fill #0

## 2020-07-14 MED FILL — TRIAMCINOLONE 0.025% OINT: 0.025 | 30 days supply | Qty: 60 | Fill #0

## 2020-07-22 MED FILL — MYORISAN 30 MG CAPSULE: 30 | 30 days supply | Qty: 60 | Fill #0

## 2020-12-08 DIAGNOSIS — M25551 Pain in right hip: Secondary | ICD-10-CM | POA: Insufficient documentation

## 2021-03-17 DIAGNOSIS — M79629 Pain in unspecified upper arm: Secondary | ICD-10-CM | POA: Insufficient documentation

## 2021-03-25 ENCOUNTER — Other Ambulatory Visit: Payer: Self-pay | Admitting: Obstetrics and Gynecology

## 2021-03-25 DIAGNOSIS — M79621 Pain in right upper arm: Secondary | ICD-10-CM

## 2021-03-25 DIAGNOSIS — N6452 Nipple discharge: Secondary | ICD-10-CM

## 2021-05-01 ENCOUNTER — Ambulatory Visit: Payer: Managed Care, Other (non HMO)

## 2021-05-01 ENCOUNTER — Ambulatory Visit
Admission: RE | Admit: 2021-05-01 | Discharge: 2021-05-01 | Disposition: A | Discharge status: Home / Self Care | Payer: Managed Care, Other (non HMO) | Source: Ambulatory Visit | Attending: Obstetrics and Gynecology | Admitting: Obstetrics and Gynecology

## 2021-05-01 ENCOUNTER — Other Ambulatory Visit: Payer: Self-pay

## 2021-05-01 DIAGNOSIS — M79621 Pain in right upper arm: Secondary | ICD-10-CM

## 2021-05-01 DIAGNOSIS — N6452 Nipple discharge: Secondary | ICD-10-CM

## 2021-05-01 IMAGING — US US BREAST*L* LIMITED INC AXILLA
1 series · 6 of 6 positions shown · non-contrast
Comparison: Previous exam(s).

CLINICAL DATA: 40-year-old female with history of right axillary
pain which has resolved. Additionally, the patient has intermittent,
spontaneous yellow/white left nipple discharge since ceasing
breast-feeding 1.5 years ago.

EXAM:
DIGITAL DIAGNOSTIC BILATERAL MAMMOGRAM WITH TOMOSYNTHESIS AND CAD;
ULTRASOUND LEFT BREAST LIMITED
TECHNIQUE: Bilateral digital diagnostic mammography and breast tomosynthesis
was performed. The images were evaluated with computer-aided
detection.; Targeted ultrasound examination of the left breast was
performed

[Series 1: us breast*left* limited inc axilla · 0.06mm/px · 6 of 6 slices shown]
[im 1/6]
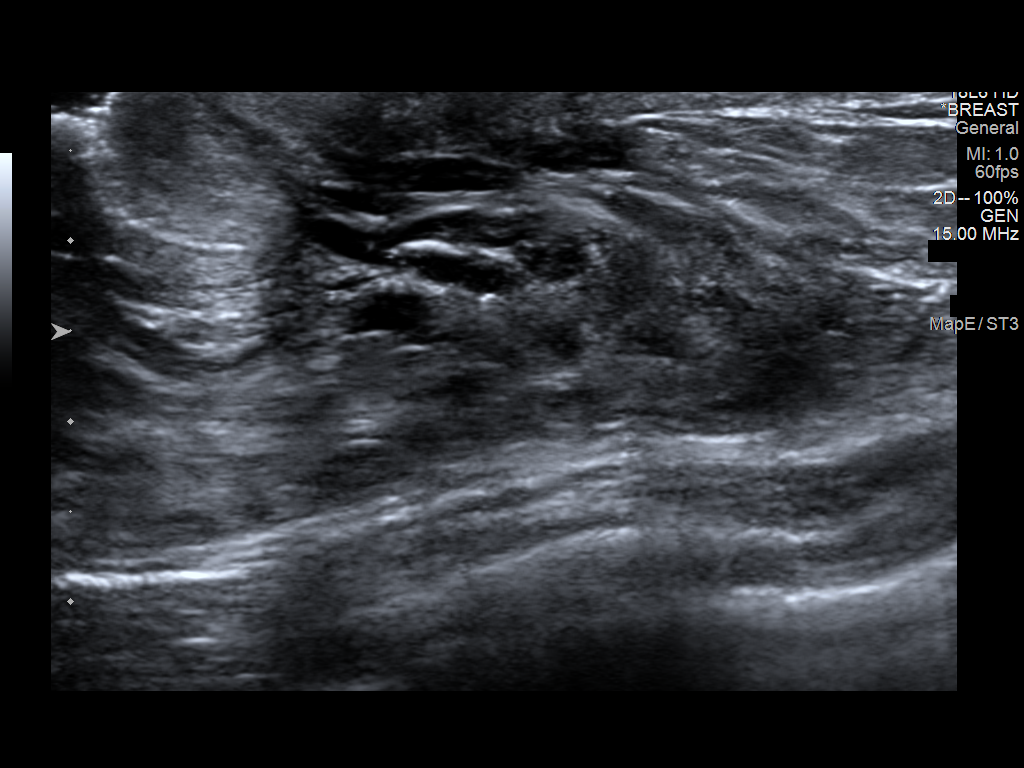
[im 2/6]
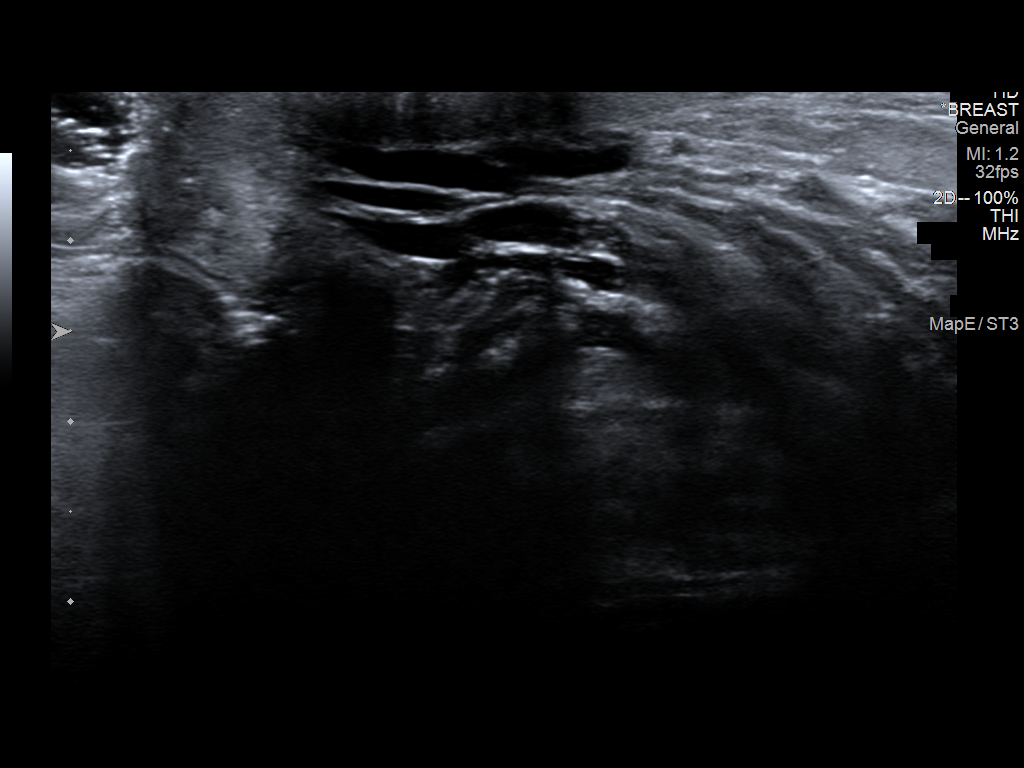
[im 3/6]
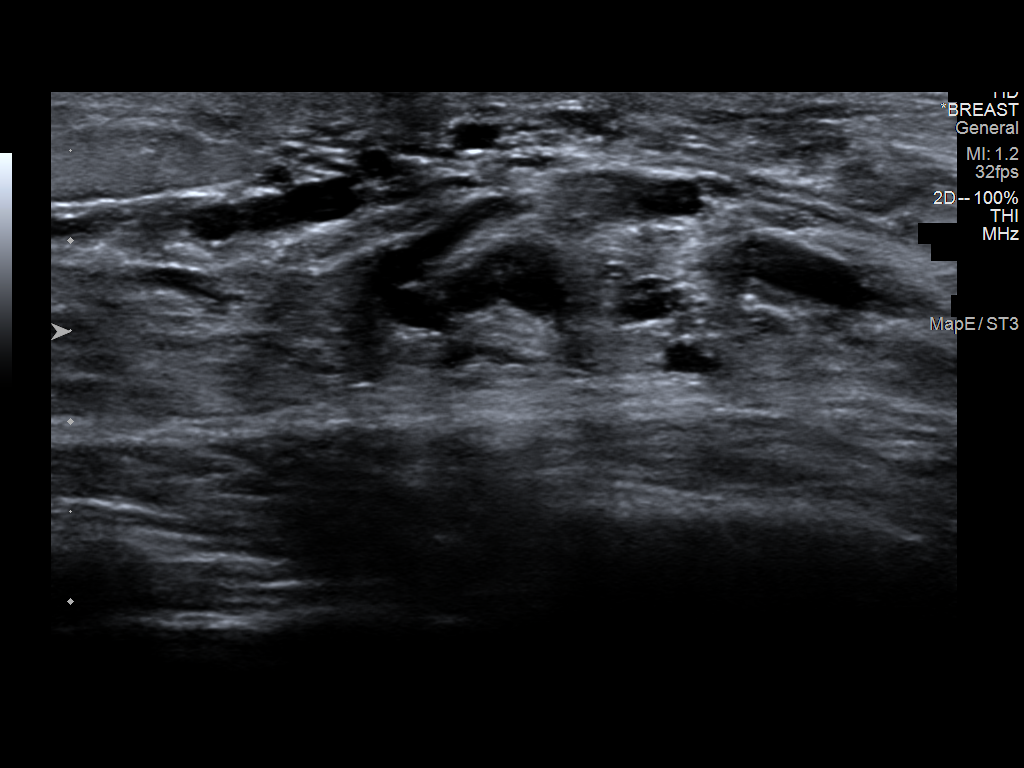
[im 4/6]
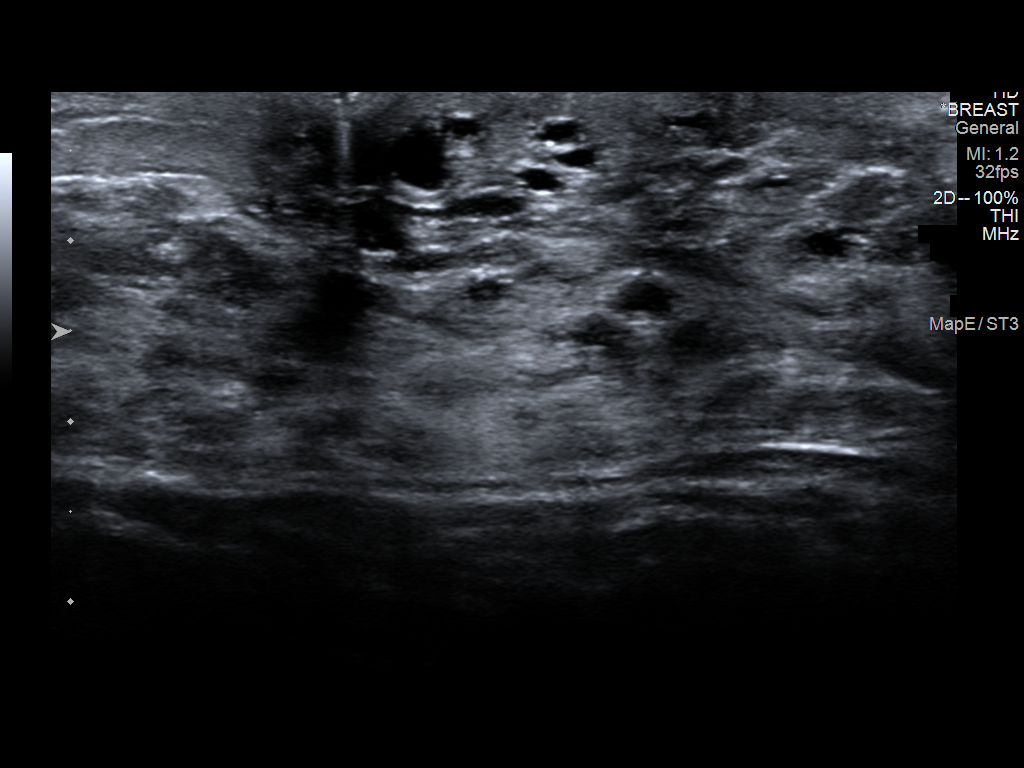
[im 5/6]
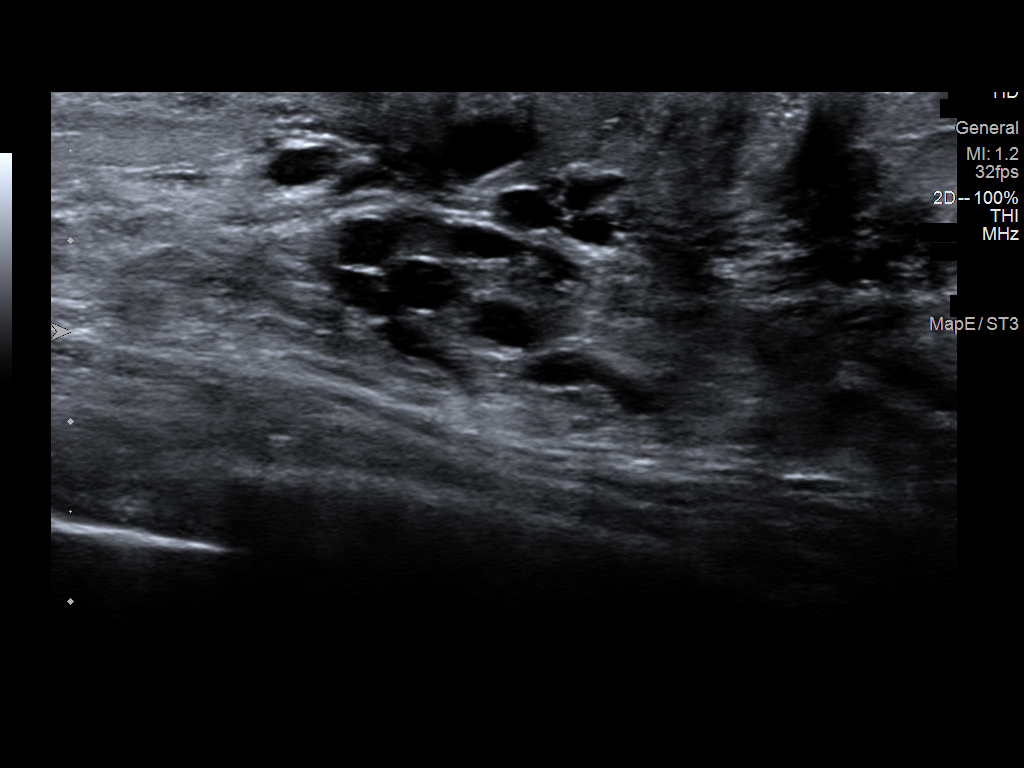
[im 6/6]
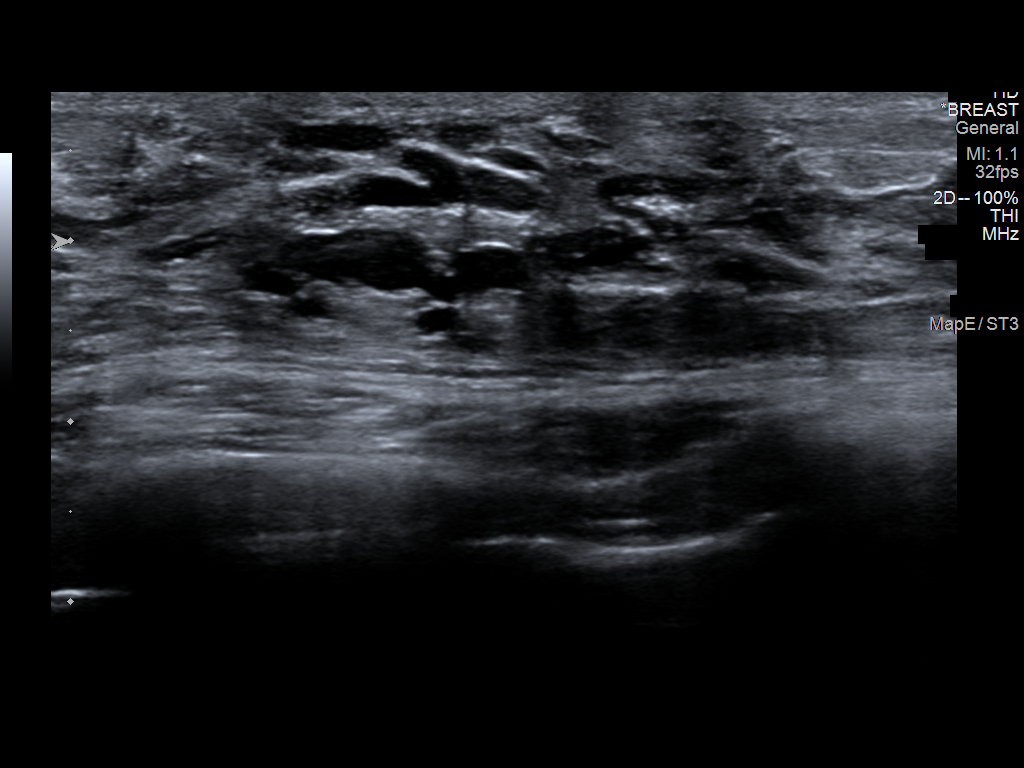

[6 of 6 positions shown; findings below may reference images not displayed]

ACR Breast Density Category d: The breast tissue is extremely dense,
which lowers the sensitivity of mammography.
FINDINGS: No suspicious mammographic findings are identified in either breast.
The parenchymal pattern is stable.

On physical exam, I am able to elicit milky discharge from multiple
ducts from the bilateral breasts, left greater than right.

Targeted ultrasound is performed, showing mildly dilated ducts
without focal or suspicious sonographic abnormality.
IMPRESSION: No mammographic or sonographic evidence of malignancy in either
breast. Physiologic type bilateral nipple discharge.

RECOMMENDATION:
1. Clinical follow-up recommended for the symptomatic area of
concern in the left breast. Any further workup should be based on
clinical grounds. Causes of nipple discharge include: Hormonal
changes, fibrocystic changes, benign papilloma, abscess/mastitis,
birth control pills, endocrine disorders, injury/trauma to breast,
duct ectasia, medications, prolactinoma, and breast cancer. As is
evident from this list, nipple discharge often stems from a benign
condition, however, breast cancer is a possibility when unilateral
spontaneous persistent single duct discharge (especially bloody or
clear discharge) is present.
2.  Screening mammogram in one year.(Code:[9A])

I have discussed the findings and recommendations with the patient.
If applicable, a reminder letter will be sent to the patient
regarding the next appointment.

BI-RADS CATEGORY  2: Benign.

## 2021-05-01 IMAGING — MG DIGITAL DIAGNOSTIC BILAT W/ TOMO W/ CAD
6 of 10 series · 6 of 30 positions shown · non-contrast
Comparison: Previous exam(s).

CLINICAL DATA: 40-year-old female with history of right axillary
pain which has resolved. Additionally, the patient has intermittent,
spontaneous yellow/white left nipple discharge since ceasing
breast-feeding 1.5 years ago.

EXAM:
DIGITAL DIAGNOSTIC BILATERAL MAMMOGRAM WITH TOMOSYNTHESIS AND CAD;
ULTRASOUND LEFT BREAST LIMITED
TECHNIQUE: Bilateral digital diagnostic mammography and breast tomosynthesis
was performed. The images were evaluated with computer-aided
detection.; Targeted ultrasound examination of the left breast was
performed

[L MLO synth-2D]
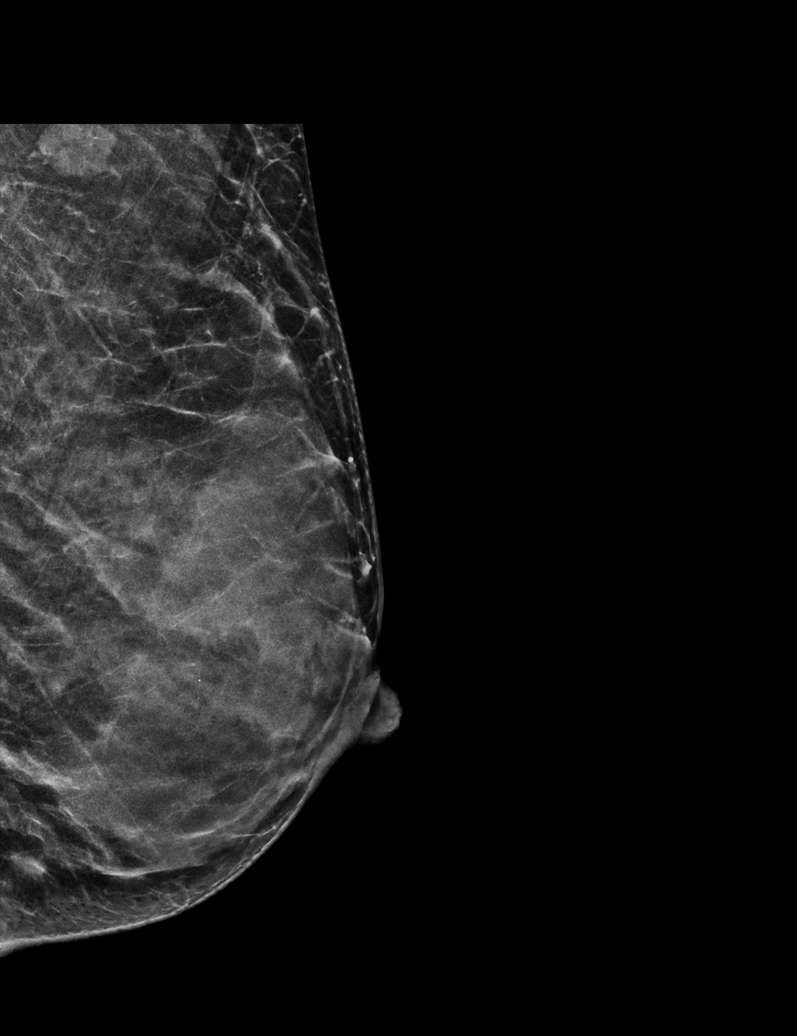

[R CC synth-2D]
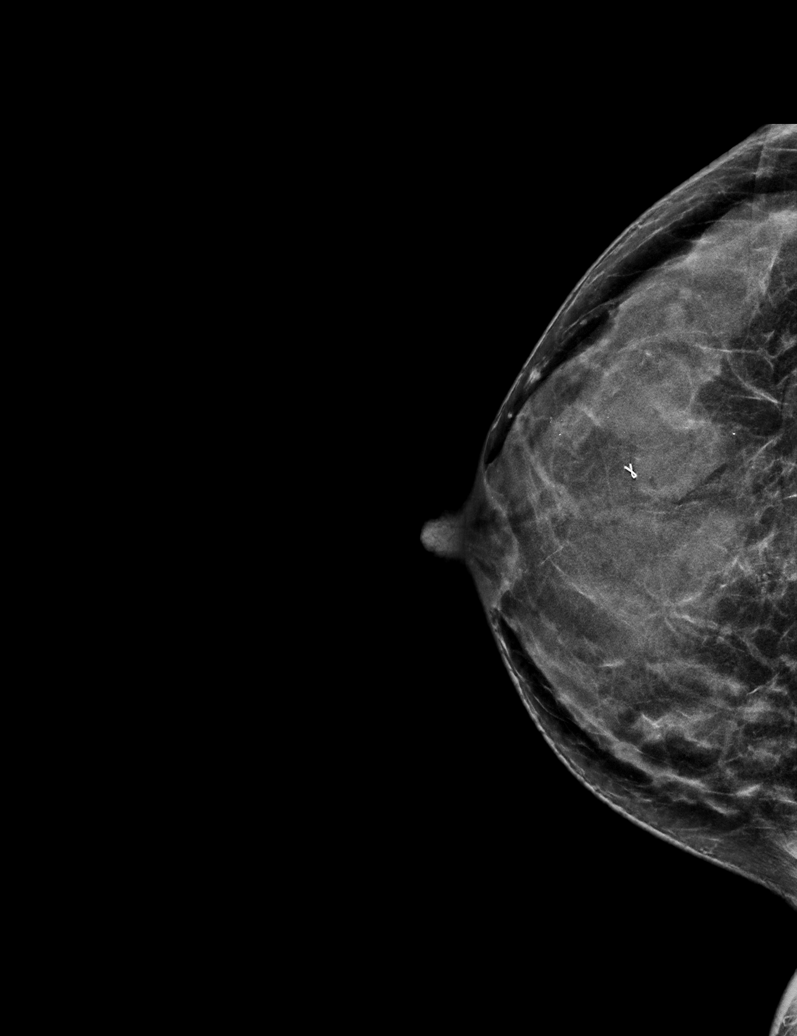

[R MLO synth-2D]
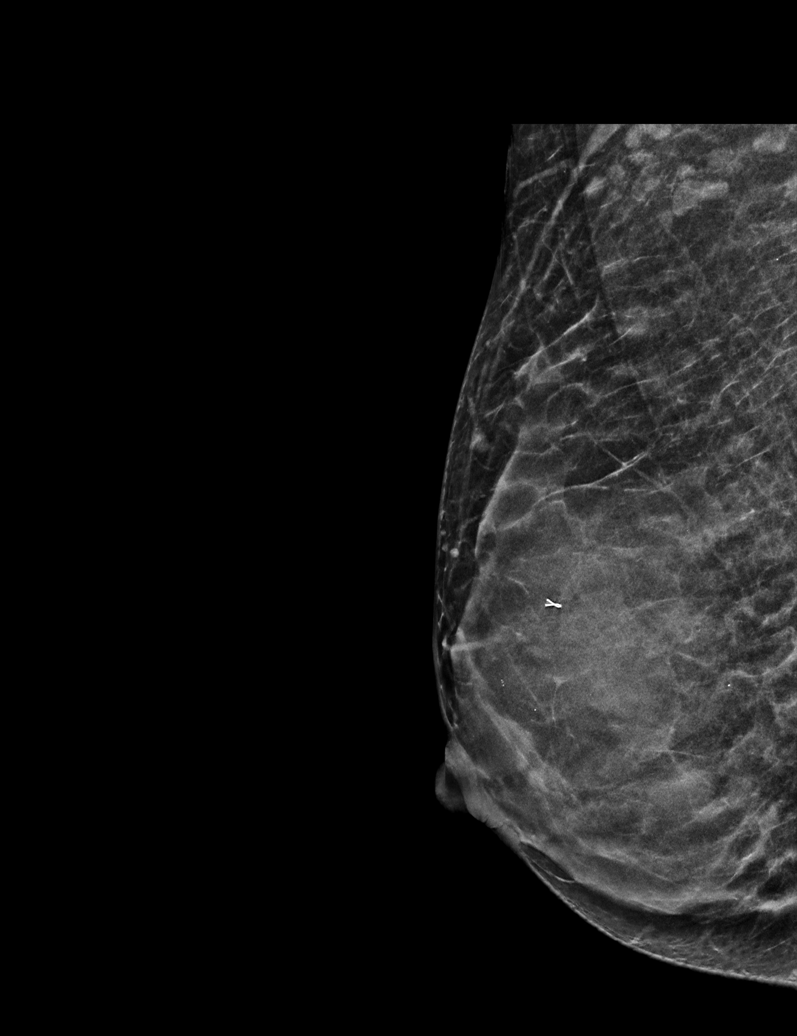

[L CC synth-2D (1 of 2)]
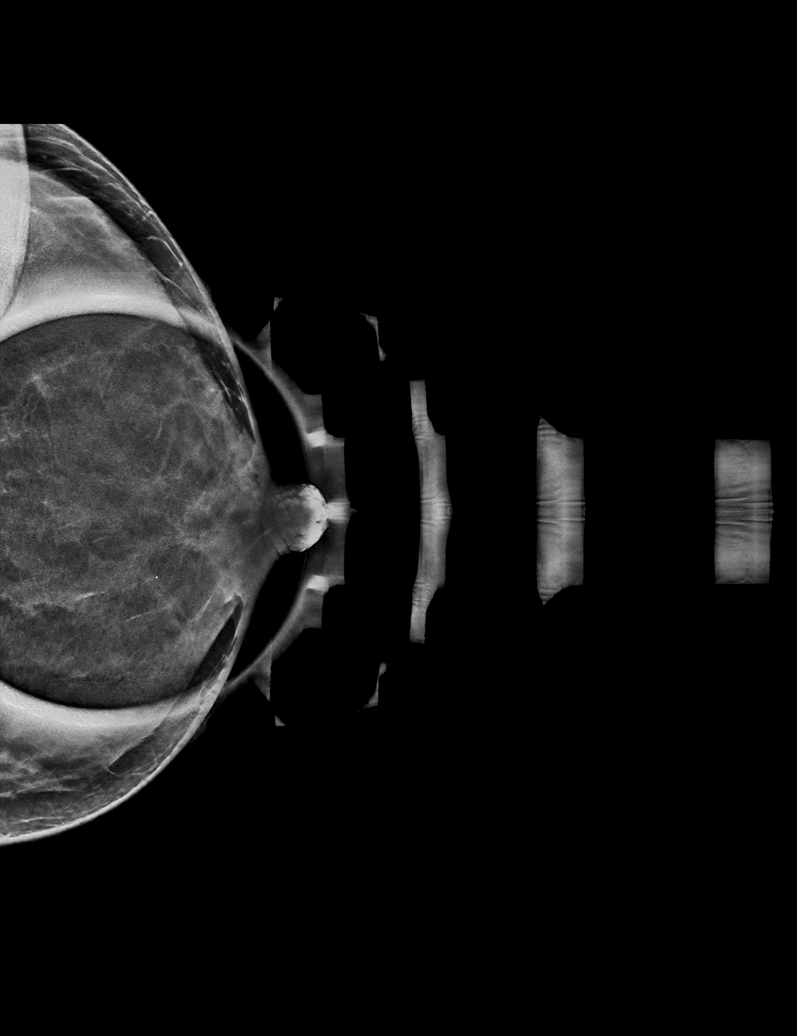

[L CC synth-2D (2 of 2)]
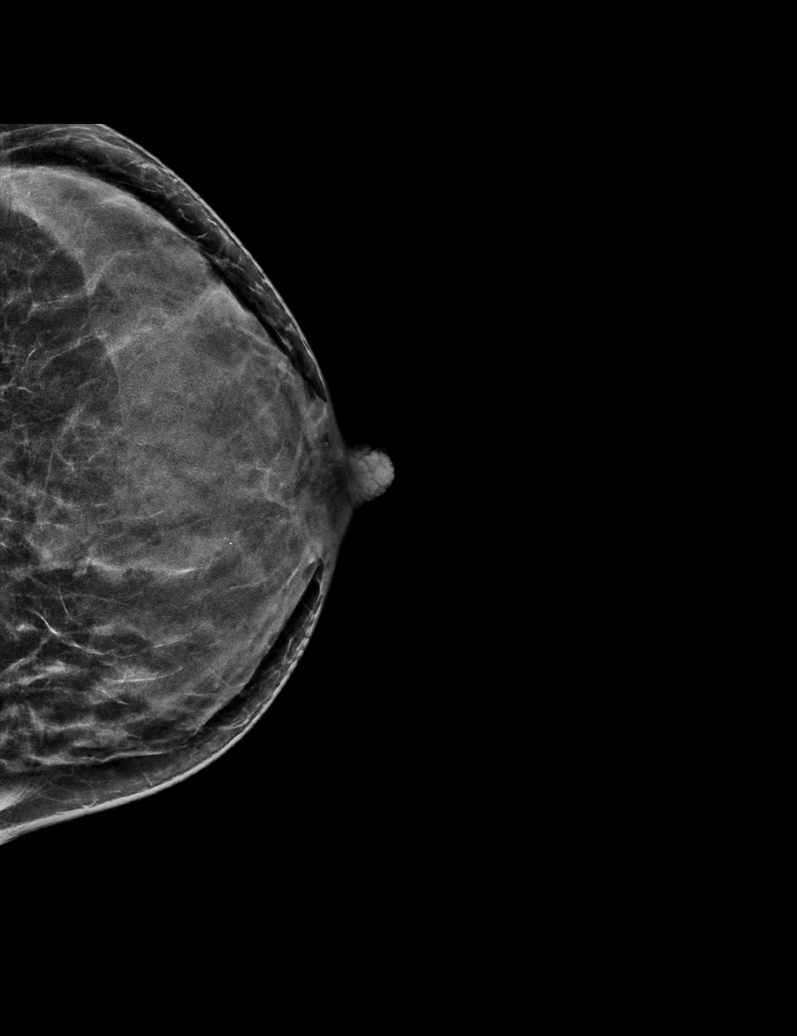

[L CC tomo · tomo slice 23/44.0]
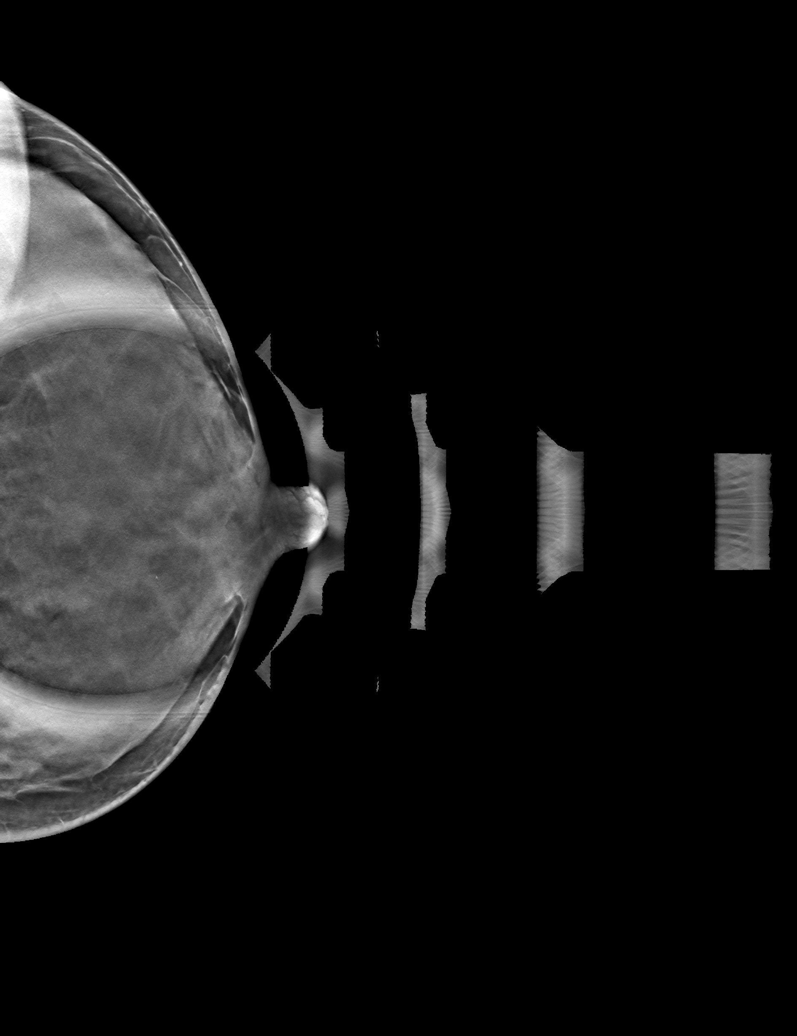

[6 of 30 positions shown; findings below may reference images not displayed]

ACR Breast Density Category d: The breast tissue is extremely dense,
which lowers the sensitivity of mammography.
FINDINGS: No suspicious mammographic findings are identified in either breast.
The parenchymal pattern is stable.

On physical exam, I am able to elicit milky discharge from multiple
ducts from the bilateral breasts, left greater than right.

Targeted ultrasound is performed, showing mildly dilated ducts
without focal or suspicious sonographic abnormality.
IMPRESSION: No mammographic or sonographic evidence of malignancy in either
breast. Physiologic type bilateral nipple discharge.

RECOMMENDATION:
1. Clinical follow-up recommended for the symptomatic area of
concern in the left breast. Any further workup should be based on
clinical grounds. Causes of nipple discharge include: Hormonal
changes, fibrocystic changes, benign papilloma, abscess/mastitis,
birth control pills, endocrine disorders, injury/trauma to breast,
duct ectasia, medications, prolactinoma, and breast cancer. As is
evident from this list, nipple discharge often stems from a benign
condition, however, breast cancer is a possibility when unilateral
spontaneous persistent single duct discharge (especially bloody or
clear discharge) is present.
2.  Screening mammogram in one year.(Code:[9A])

I have discussed the findings and recommendations with the patient.
If applicable, a reminder letter will be sent to the patient
regarding the next appointment.

BI-RADS CATEGORY  2: Benign.

## 2021-08-25 ENCOUNTER — Other Ambulatory Visit (HOSPITAL_COMMUNITY): Payer: Self-pay

## 2021-08-25 MED ORDER — FLUOCINOLONE ACETONIDE 0.01 % OT OIL
TOPICAL_OIL | OTIC | 0 refills | Status: DC
  Filled 2021-08-25: qty 20, 29d supply, fill #0

## 2021-09-29 ENCOUNTER — Other Ambulatory Visit: Payer: Self-pay | Admitting: Obstetrics and Gynecology

## 2021-09-29 DIAGNOSIS — R928 Other abnormal and inconclusive findings on diagnostic imaging of breast: Secondary | ICD-10-CM

## 2021-10-12 ENCOUNTER — Other Ambulatory Visit: Payer: Self-pay

## 2021-10-12 ENCOUNTER — Ambulatory Visit
Admission: RE | Admit: 2021-10-12 | Discharge: 2021-10-12 | Disposition: A | Discharge status: Home / Self Care | Payer: Managed Care, Other (non HMO) | Source: Ambulatory Visit | Attending: Obstetrics and Gynecology | Admitting: Obstetrics and Gynecology

## 2021-10-12 DIAGNOSIS — R928 Other abnormal and inconclusive findings on diagnostic imaging of breast: Secondary | ICD-10-CM

## 2021-10-12 MED ORDER — GADOBUTROL 1 MMOL/ML IV SOLN
6.0000 mL | Freq: Once | INTRAVENOUS | Status: AC | PRN
  Administered 2021-10-12: 6 mL via INTRAVENOUS

## 2021-10-13 ENCOUNTER — Other Ambulatory Visit: Payer: Self-pay | Admitting: Obstetrics and Gynecology

## 2021-10-13 DIAGNOSIS — N6323 Unspecified lump in the left breast, lower outer quadrant: Secondary | ICD-10-CM

## 2021-10-14 ENCOUNTER — Other Ambulatory Visit: Payer: Self-pay | Admitting: Obstetrics and Gynecology

## 2021-10-14 DIAGNOSIS — N6323 Unspecified lump in the left breast, lower outer quadrant: Secondary | ICD-10-CM

## 2021-10-21 ENCOUNTER — Other Ambulatory Visit: Payer: Self-pay

## 2021-10-21 ENCOUNTER — Ambulatory Visit
Admission: RE | Admit: 2021-10-21 | Discharge: 2021-10-21 | Disposition: A | Discharge status: Home / Self Care | Payer: Managed Care, Other (non HMO) | Source: Ambulatory Visit | Attending: Obstetrics and Gynecology | Admitting: Obstetrics and Gynecology

## 2021-10-21 DIAGNOSIS — N6323 Unspecified lump in the left breast, lower outer quadrant: Secondary | ICD-10-CM

## 2021-10-21 IMAGING — US US BREAST*L* LIMITED INC AXILLA
1 series · 8 of 8 positions shown · non-contrast
Comparison: Previous exams, including the recent Breast MRI
[DATE].

CLINICAL DATA: 40-year-old presenting for second-look ultrasound of
both breasts after recent MRI examination. She presented in [DATE] with a spontaneous intermittent yellow/white LEFT nipple
feeding and she complains of diffuse RIGHT breast pain. The recent
MRI examination demonstrated enhancing masses in the upper RIGHT
breast at or near the 12 o'clock location, the LOWER OUTER QUADRANT
of the RIGHT breast, and the LOWER OUTER QUADRANT of the LEFT
breast.

[Series 1: us breast*left* limited inc axilla · 0.05mm/px · 8 of 8 slices shown]
[im 1/8]
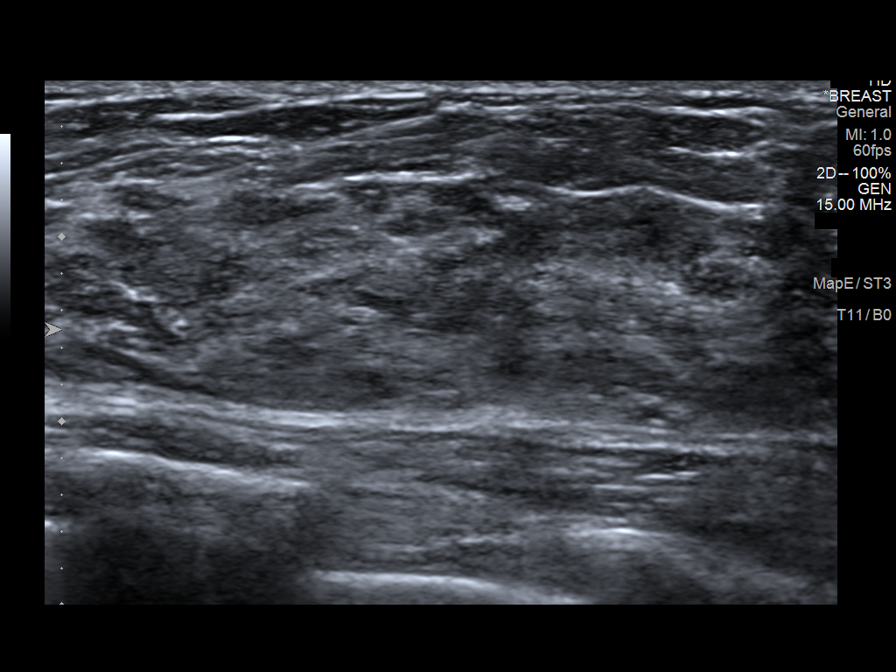
[im 2/8]
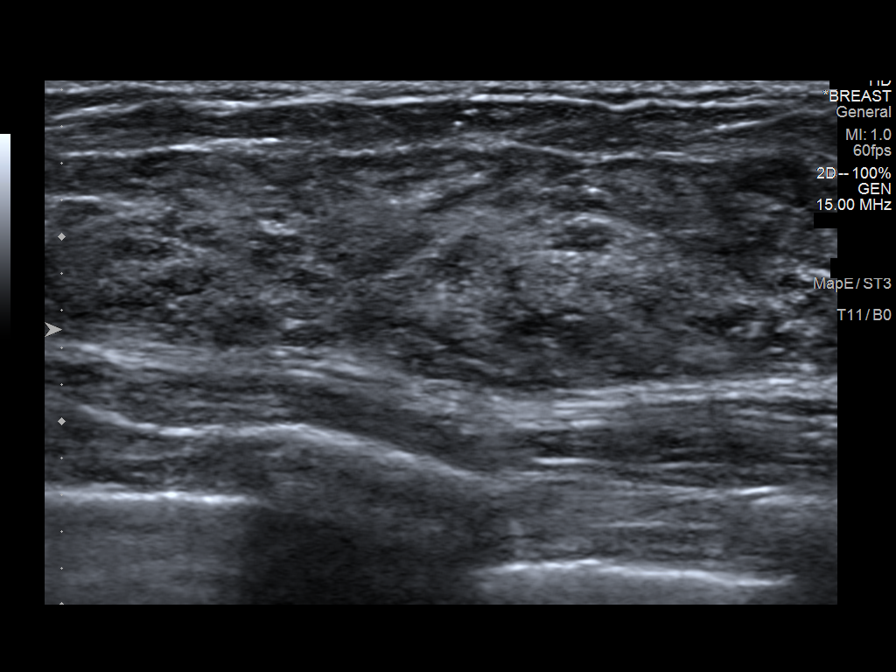
[im 3/8]
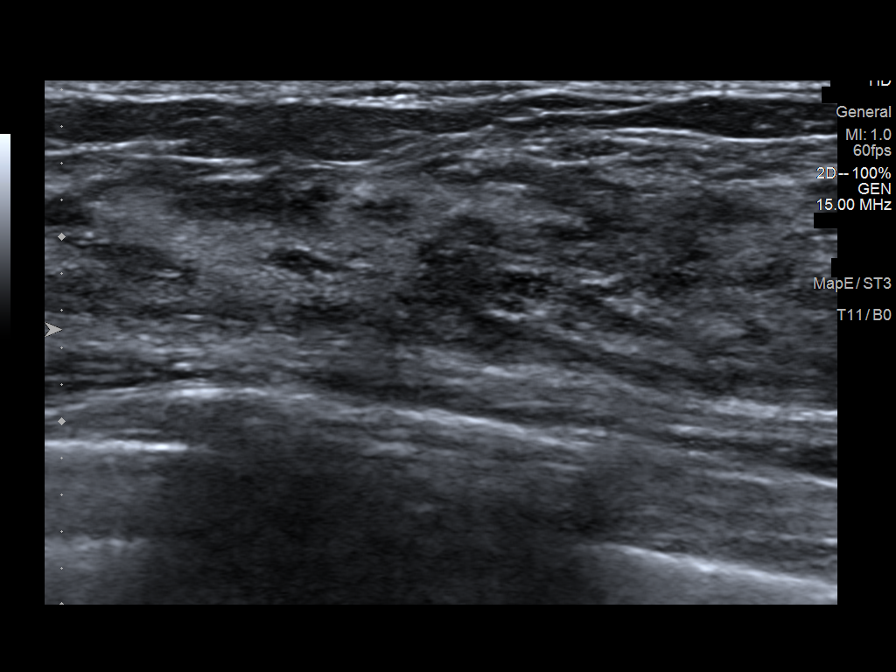
[im 4/8]
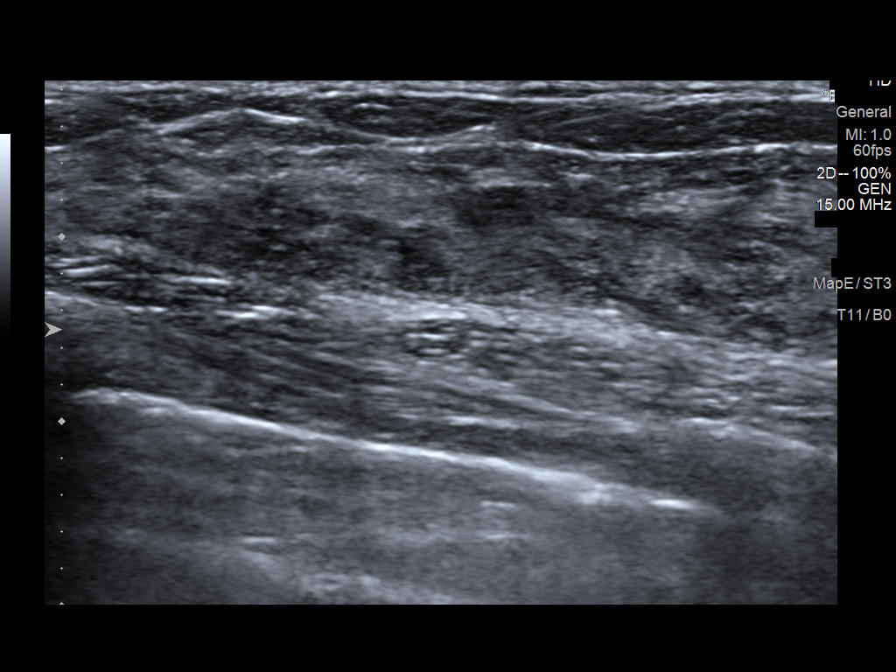
[im 5/8]
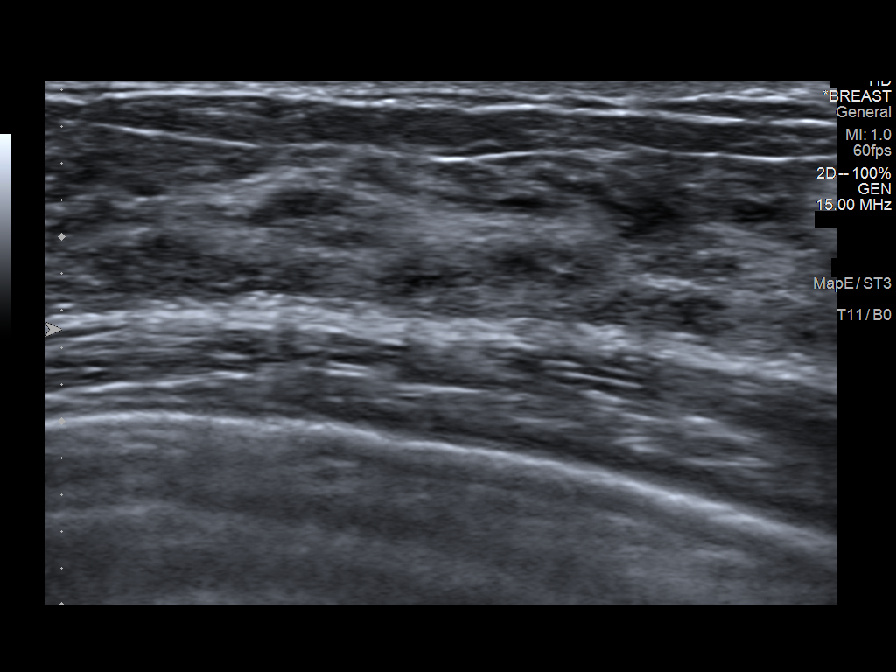
[im 6/8]
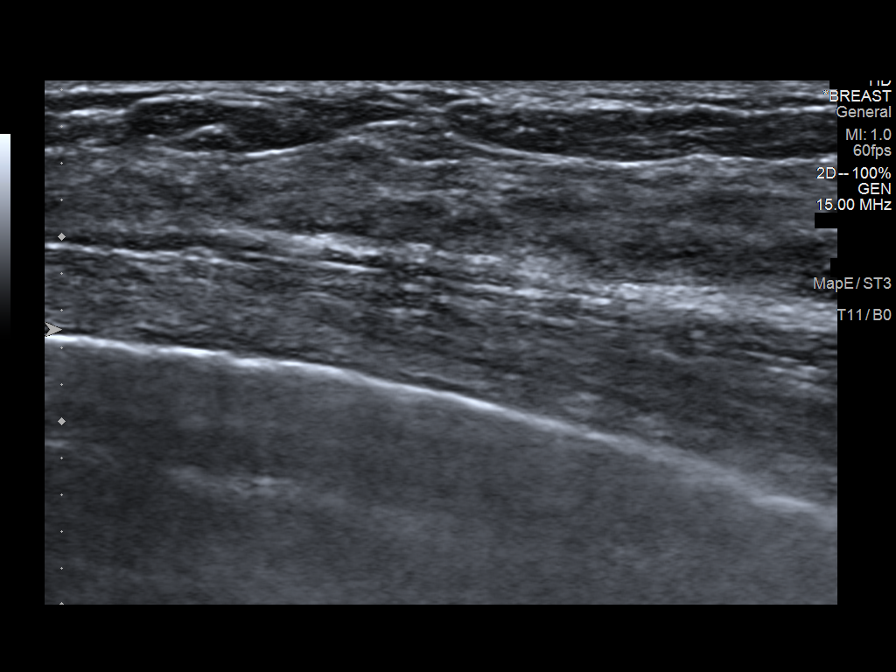
[im 7/8]
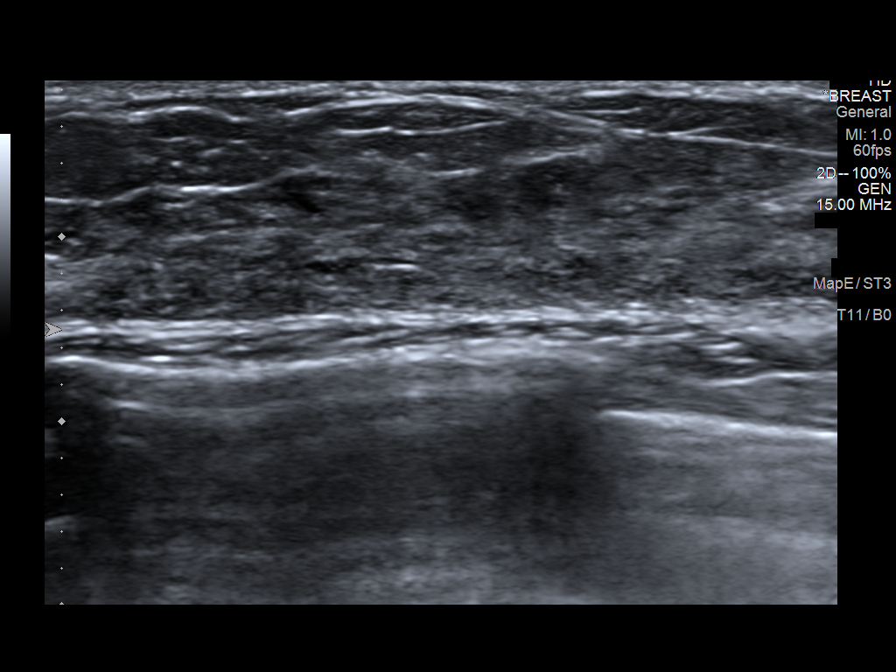
[im 8/8]
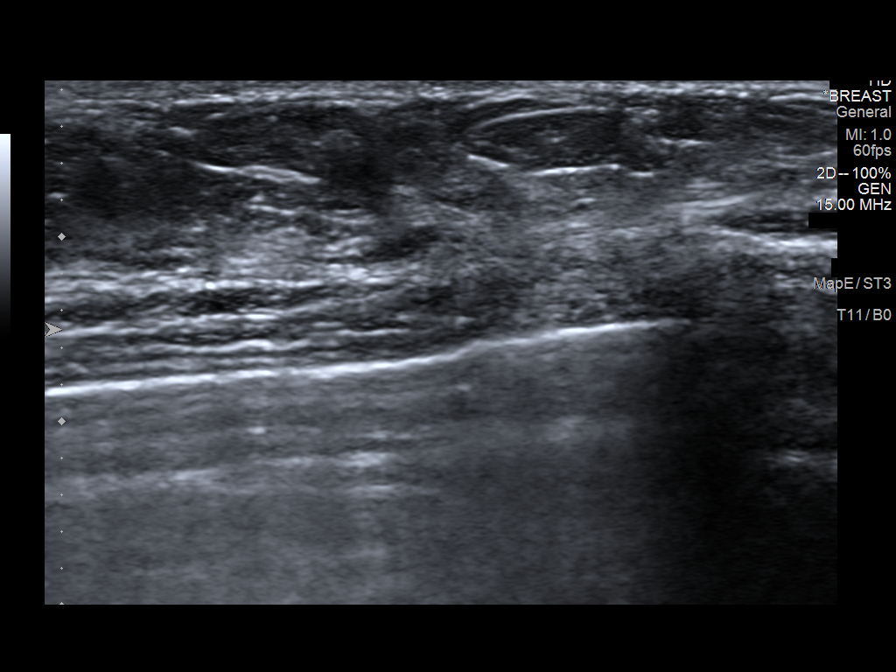

[8 of 8 positions shown; findings below may reference images not displayed]

Personal history of benign core needle biopsy of the upper RIGHT
breast at the 12 o'clock position 1 cm from the nipple in [DATE], pathology revealing PASH and fibrocystic changes. The mass
identified in the upper RIGHT breast on MRI is immediately adjacent
to the tissue marking clip.

Family history of metastatic breast cancer in a maternal aunt.

EXAM:
ULTRASOUND OF THE BILATERAL BREASTS
FINDINGS: RIGHT: Targeted ultrasound is performed, demonstrating the echogenic
biopsy clip at the 12 o'clock position 1 cm from the nipple, though
the previously identified mass is much less conspicuous currently.
Dense fibroglandular tissue is present in the upper breast. No
suspicious solid mass or abnormal acoustic shadowing is identified.

Dense fibroglandular tissue is present throughout the LOWER OUTER
QUADRANT with imaging from [DATE] to [DATE]. No suspicious solid mass or
abnormal acoustic shadowing is identified.

LEFT: Targeted ultrasound is performed, demonstrating dense
fibroglandular tissue throughout the LOWER OUTER QUADRANT of imaging
from [DATE] to [DATE].
IMPRESSION: 1. No sonographic correlate for the MRI masses identified in the
lower outer quadrants of both breasts.
2. The mass in the upper RIGHT breast on MRI is felt to correlate
with the previously biopsied mass at the 12 o'clock position 1 cm
from nipple. No other masses are identified in the upper breast.

RECOMMENDATION:
MRI guided core needle biopsies of the masses in the lower outer
quadrants of both breasts.

The MRI guided core needle biopsy procedure was discussed with the
patient her questions were answered. She wishes to proceed and the
biopsy has been scheduled by the [REDACTED] staff.

I have discussed the findings and recommendations with the patient.

BI-RADS CATEGORY  4: Suspicious.

## 2021-10-21 IMAGING — US US BREAST*R* LIMITED INC AXILLA
1 series · 11 of 11 positions shown · non-contrast
Comparison: Previous exams, including the recent Breast MRI
[DATE].

CLINICAL DATA: 40-year-old presenting for second-look ultrasound of
both breasts after recent MRI examination. She presented in [DATE] with a spontaneous intermittent yellow/white LEFT nipple
feeding and she complains of diffuse RIGHT breast pain. The recent
MRI examination demonstrated enhancing masses in the upper RIGHT
breast at or near the 12 o'clock location, the LOWER OUTER QUADRANT
of the RIGHT breast, and the LOWER OUTER QUADRANT of the LEFT
breast.

[Series 1: us breast*right* limited inc axilla · 0.05mm/px · 11 of 11 slices shown]
[im 1/11]
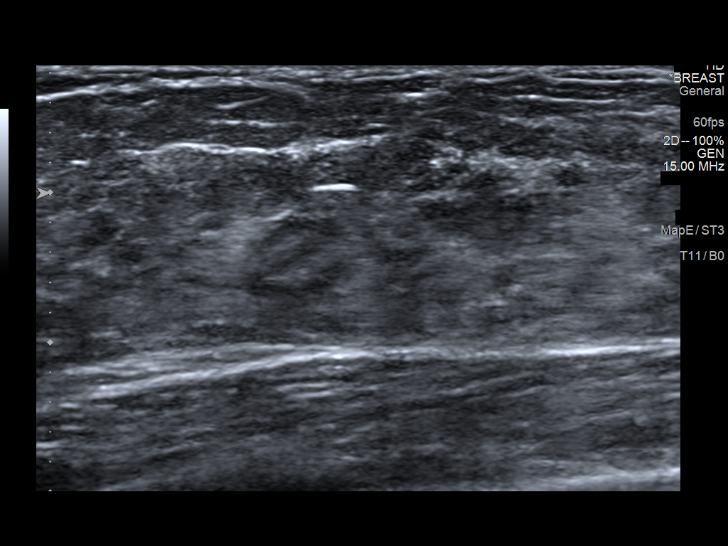
[im 2/11]
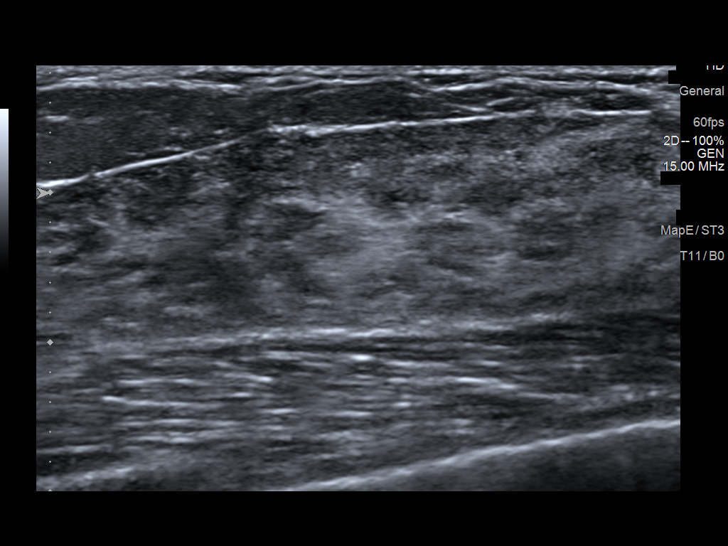
[im 3/11]
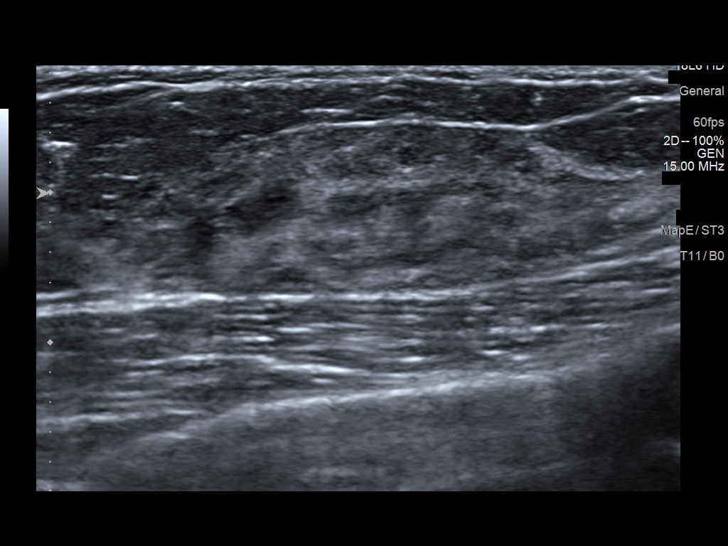
[im 4/11]
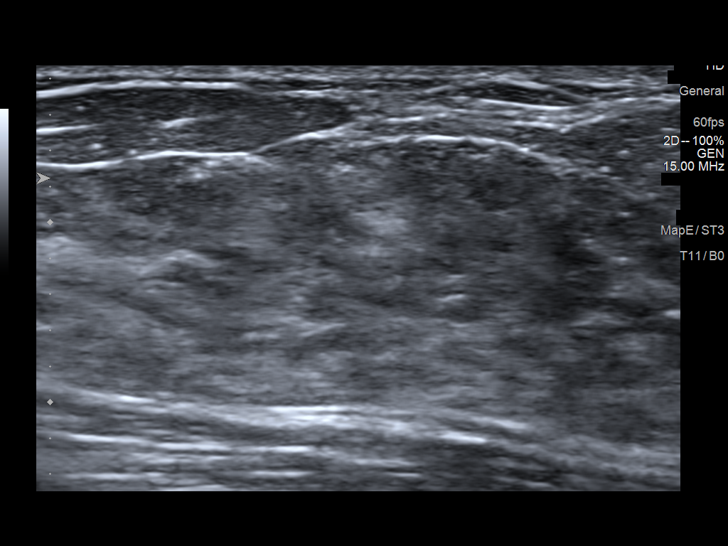
[im 5/11]
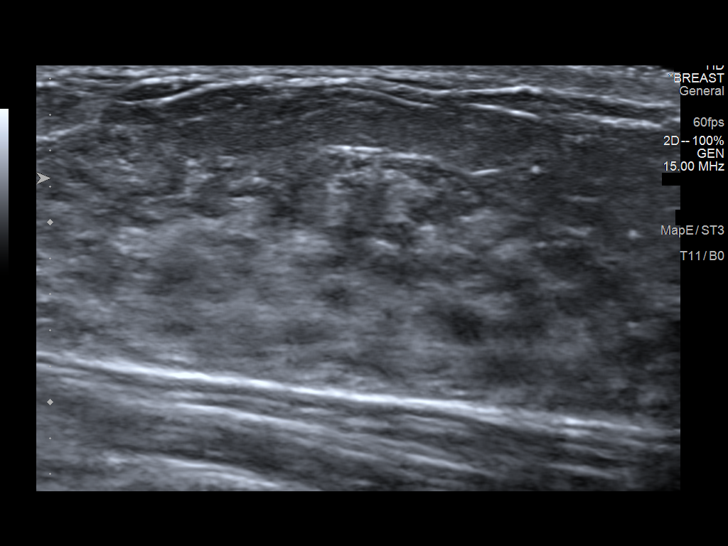
[im 6/11]
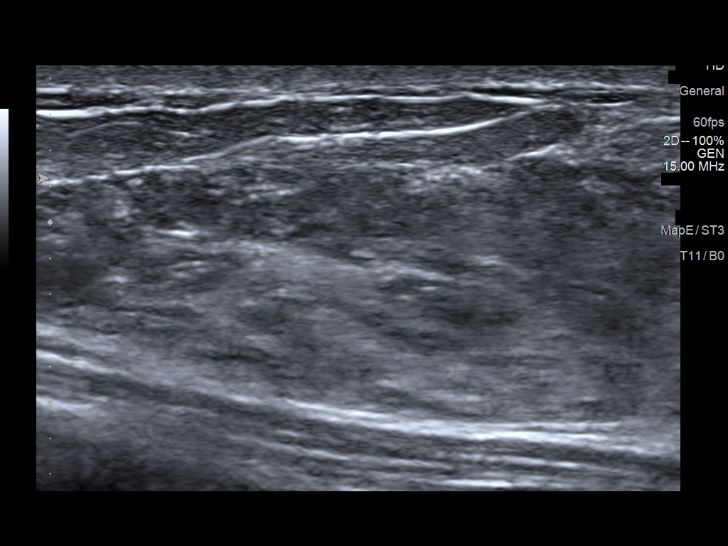
[im 7/11]
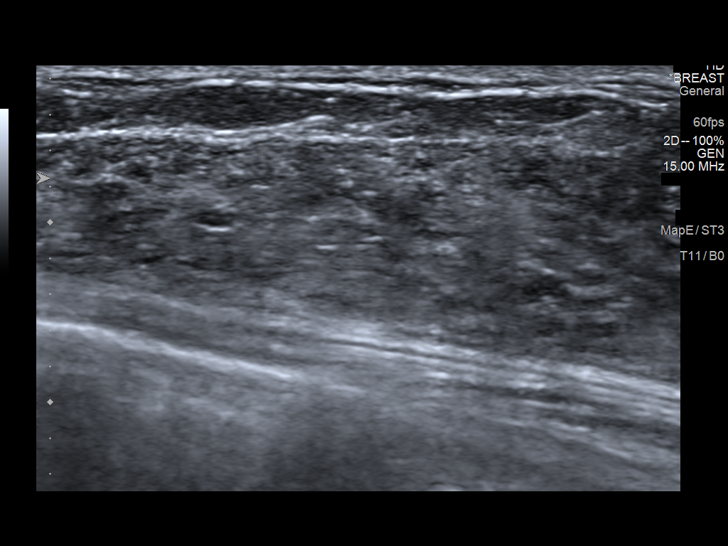
[im 8/11]
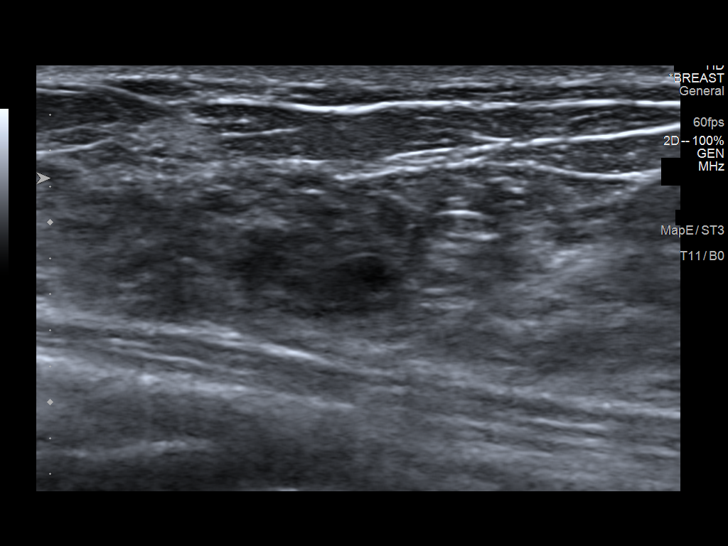
[im 9/11]
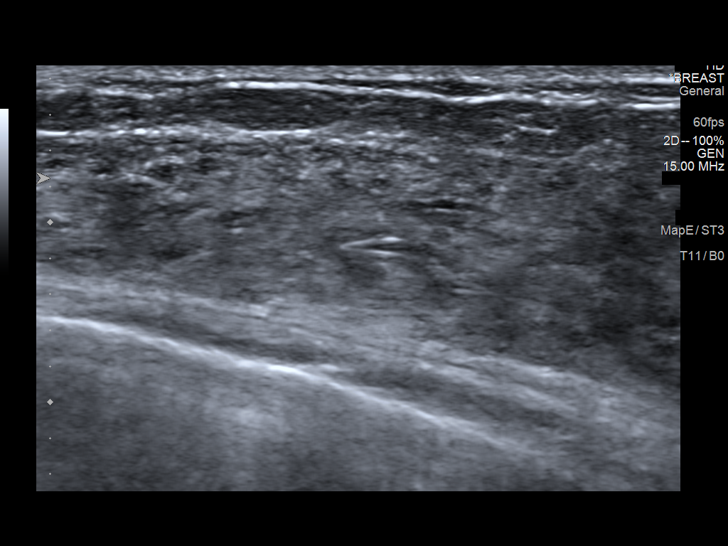
[im 10/11]
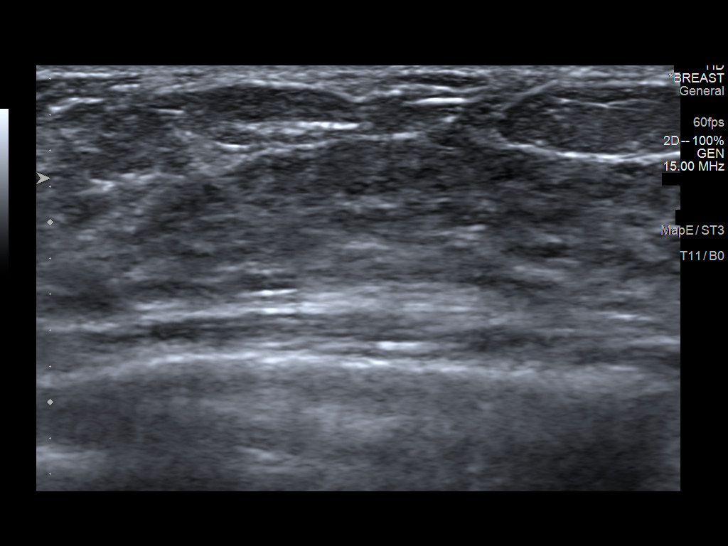
[im 11/11]
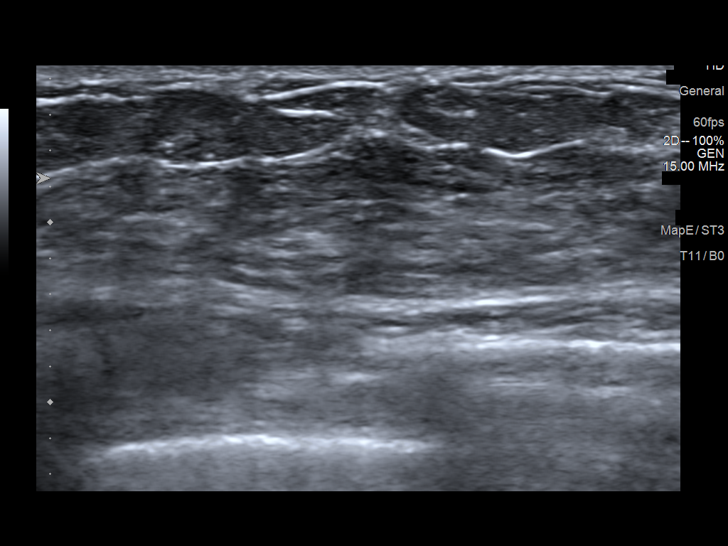

[11 of 11 positions shown; findings below may reference images not displayed]

Personal history of benign core needle biopsy of the upper RIGHT
breast at the 12 o'clock position 1 cm from the nipple in [DATE], pathology revealing PASH and fibrocystic changes. The mass
identified in the upper RIGHT breast on MRI is immediately adjacent
to the tissue marking clip.

Family history of metastatic breast cancer in a maternal aunt.

EXAM:
ULTRASOUND OF THE BILATERAL BREASTS
FINDINGS: RIGHT: Targeted ultrasound is performed, demonstrating the echogenic
biopsy clip at the 12 o'clock position 1 cm from the nipple, though
the previously identified mass is much less conspicuous currently.
Dense fibroglandular tissue is present in the upper breast. No
suspicious solid mass or abnormal acoustic shadowing is identified.

Dense fibroglandular tissue is present throughout the LOWER OUTER
QUADRANT with imaging from [DATE] to [DATE]. No suspicious solid mass or
abnormal acoustic shadowing is identified.

LEFT: Targeted ultrasound is performed, demonstrating dense
fibroglandular tissue throughout the LOWER OUTER QUADRANT of imaging
from [DATE] to [DATE].
IMPRESSION: 1. No sonographic correlate for the MRI masses identified in the
lower outer quadrants of both breasts.
2. The mass in the upper RIGHT breast on MRI is felt to correlate
with the previously biopsied mass at the 12 o'clock position 1 cm
from nipple. No other masses are identified in the upper breast.

RECOMMENDATION:
MRI guided core needle biopsies of the masses in the lower outer
quadrants of both breasts.

The MRI guided core needle biopsy procedure was discussed with the
patient her questions were answered. She wishes to proceed and the
biopsy has been scheduled by the [REDACTED] staff.

I have discussed the findings and recommendations with the patient.

BI-RADS CATEGORY  4: Suspicious.

## 2021-10-22 ENCOUNTER — Other Ambulatory Visit: Payer: Self-pay | Admitting: Obstetrics and Gynecology

## 2021-10-22 DIAGNOSIS — R9389 Abnormal findings on diagnostic imaging of other specified body structures: Secondary | ICD-10-CM

## 2021-10-26 ENCOUNTER — Other Ambulatory Visit: Payer: Managed Care, Other (non HMO)

## 2021-10-27 ENCOUNTER — Other Ambulatory Visit: Payer: Managed Care, Other (non HMO)

## 2021-10-29 ENCOUNTER — Ambulatory Visit
Admission: RE | Admit: 2021-10-29 | Discharge: 2021-10-29 | Disposition: A | Discharge status: Home / Self Care | Payer: Managed Care, Other (non HMO) | Source: Ambulatory Visit | Attending: Obstetrics and Gynecology | Admitting: Obstetrics and Gynecology

## 2021-10-29 ENCOUNTER — Other Ambulatory Visit: Payer: Self-pay

## 2021-10-29 ENCOUNTER — Other Ambulatory Visit (HOSPITAL_COMMUNITY): Payer: Self-pay | Admitting: Diagnostic Radiology

## 2021-10-29 DIAGNOSIS — R9389 Abnormal findings on diagnostic imaging of other specified body structures: Secondary | ICD-10-CM

## 2021-10-29 HISTORY — PX: BREAST BIOPSY: SHX20

## 2021-10-29 IMAGING — MR MR BREAST BX W LOC DEV 1ST LESION IMAGE BX SPEC MR GUIDE*L*
9 of 12 series · 33 of 48 positions shown · IV contrast (gadavist)
Comparison: Previous exams.

Addendum:

ADDENDUM:
Pathology revealed FIBROCYSTIC CHANGES AND PSEUDOANGIOMATOUS STROMAL
HYPERPLASIA of the LEFT breast, lower outer quadrant, (cylinder
clip). This was found to be concordant by Dr. PULLAT.
Pathology results were discussed with the patient by telephone. The
patient reported doing well after the biopsy with tenderness at the
site. Post biopsy instructions and care were reviewed and questions
were answered. The patient was encouraged to call The [REDACTED] for any additional concerns. My direct phone
number was provided.
The patient was instructed to return for a BILATERAL breast MRI in 6
months, per protocol, and to evaluate the TWO irregular enhancing
masses in the RIGHT breast, not seen at the time of LEFT MRI biopsy
on [DATE]. It is recommended that the follow-up MRI be
performed 1 week after menstrual period.
Pathology results reported by PULLAT, RN on [DATE].
*** End of Addendum ***
CLINICAL DATA: Two irregular enhancing masses in the right breast
on a recent MRI for diffuse right breast pain and spontaneous left
nipple discharge. One of these corresponded to a mass previously
biopsied under ultrasound guidance with benign results. There was
also a 1.0 cm irregular enhancing mass in the lower outer quadrant
of the left breast on the MRI. MR guided core needle biopsies of the
mass in the lower outer quadrant of the right breast and mass in the
lower outer quadrant of the left breast was recommended.
EXAM:
MRI GUIDED CORE NEEDLE BIOPSY OF THE LEFT BREAST
TECHNIQUE: Multiplanar, multisequence MR imaging of the both breasts was
performed both before and after administration of intravenous
contrast.
CONTRAST:  6mL GADAVIST GADOBUTROL 1 MMOL/ML IV SOLN

[Series 3: fiducial bilateral · sagittal · 2.0mm · 1.33mm/px · 4 of 128 slices shown]
[im 1/128]
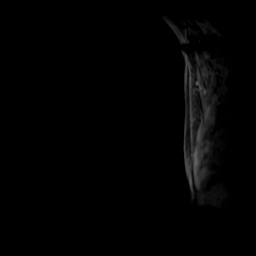
[im 43/128]
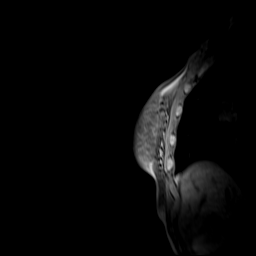
[im 85/128]
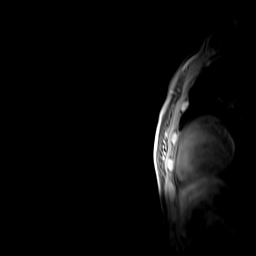
[im 128/128]
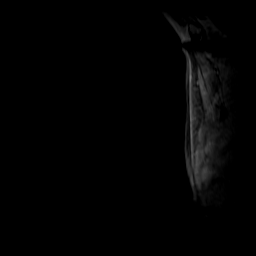

[Series 4: dynamic pre · axial · non-contrast · 1.3mm · 0.73mm/px · z∈[-82,+146]mm · 4 of 176 slices shown]
[im 1/176]
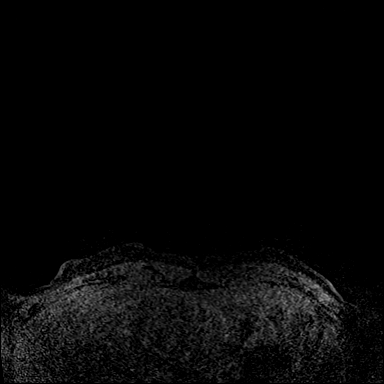
[im 59/176]
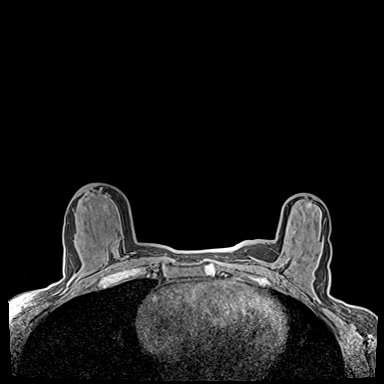
[im 117/176]
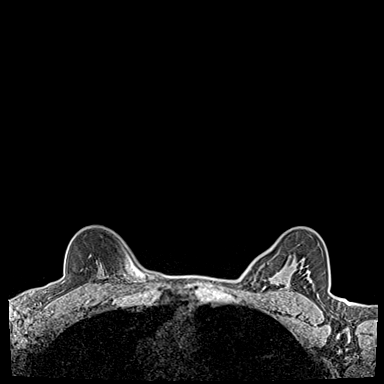
[im 176/176]
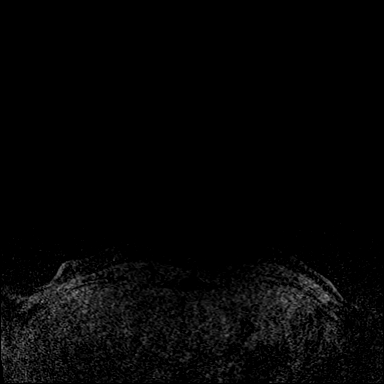

[Series 5: dynamic post 20 · axial · 1.3mm · 0.73mm/px · z∈[-82,+146]mm · 4 of 176 slices shown (1 of 2)]
[im 1/176]
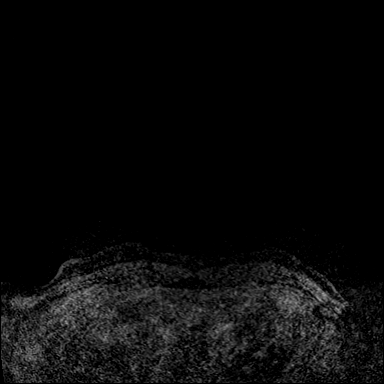
[im 59/176]
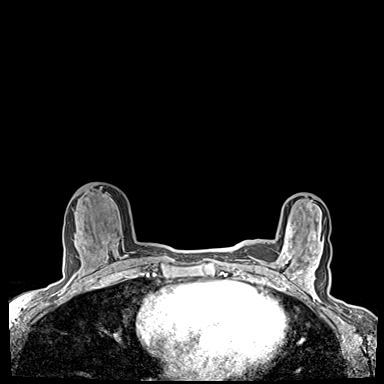
[im 117/176]
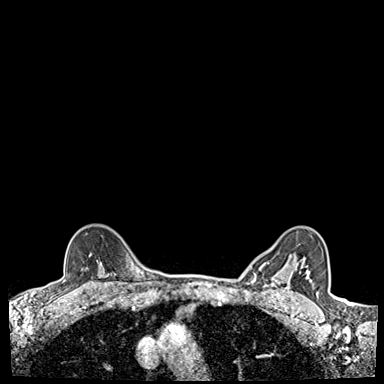
[im 176/176]
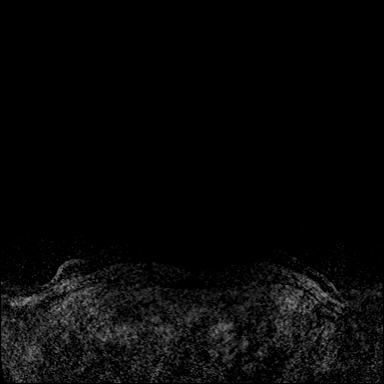

[Series 6: dynamic post 20 · axial · 1.3mm · 0.73mm/px · z∈[-82,+146]mm · 4 of 176 slices shown (2 of 2)]
[im 1/176]
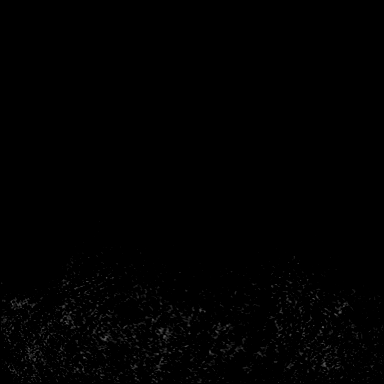
[im 59/176]
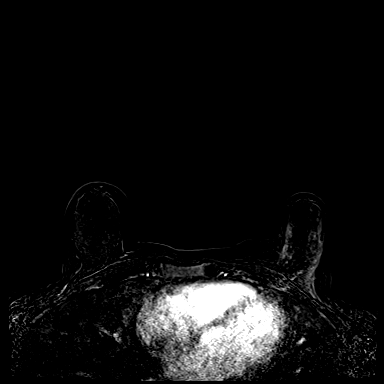
[im 117/176]
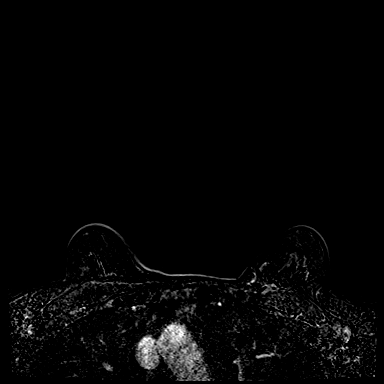
[im 176/176]
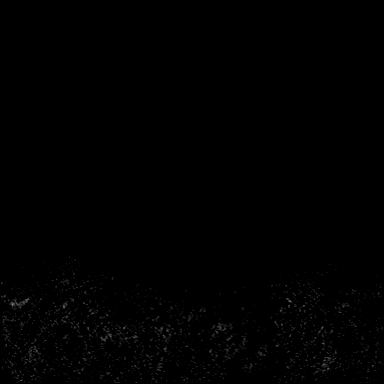

[Series 7: dynamic post 3 · axial · 1.3mm · 0.73mm/px · z∈[-82,+146]mm · 4 of 176 slices shown (1 of 2)]
[im 1/176]
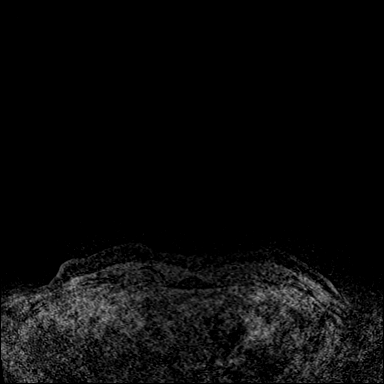
[im 59/176]
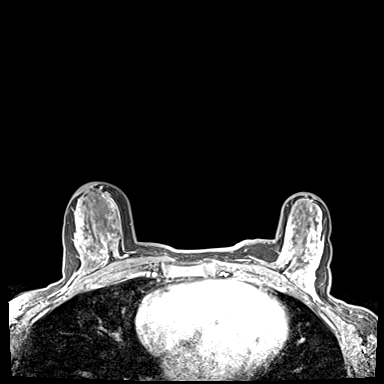
[im 117/176]
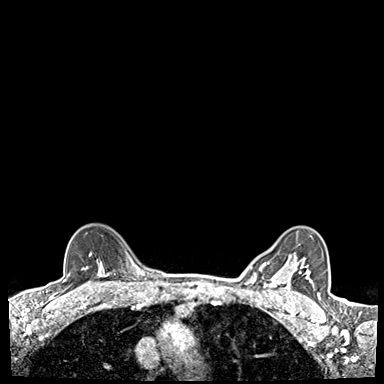
[im 176/176]
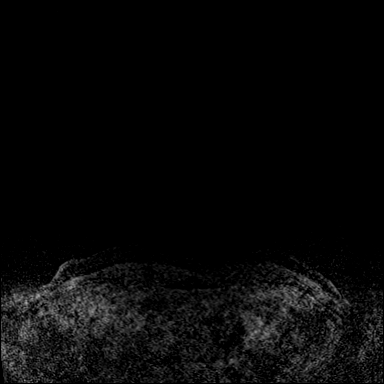

[Series 8: dynamic post 3 · axial · 1.3mm · 0.73mm/px · z∈[-82,+146]mm · 4 of 176 slices shown (2 of 2)]
[im 1/176]
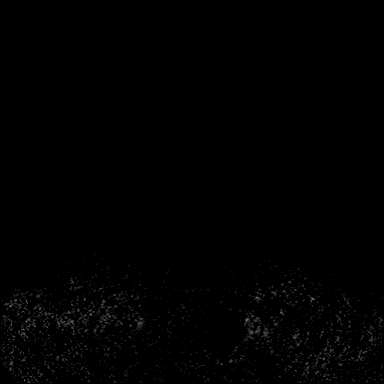
[im 59/176]
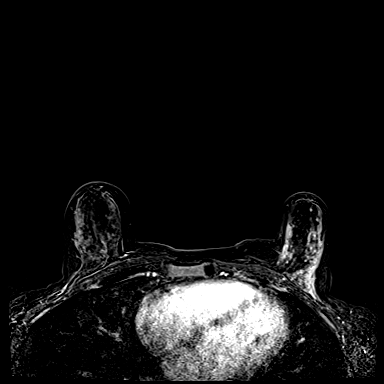
[im 117/176]
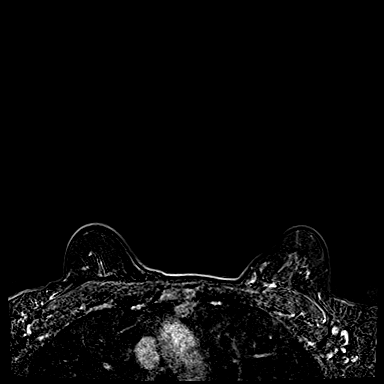
[im 176/176]
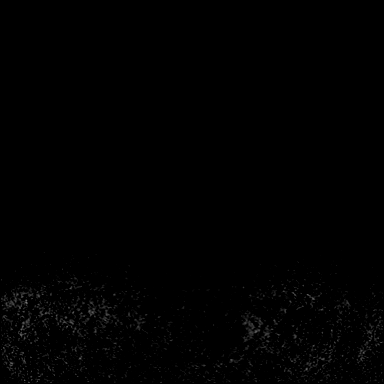

[Series 9: dynamic post 6 · axial · 1.3mm · 0.73mm/px · z∈[-82,+146]mm · 4 of 176 slices shown (1 of 2)]
[im 1/176]
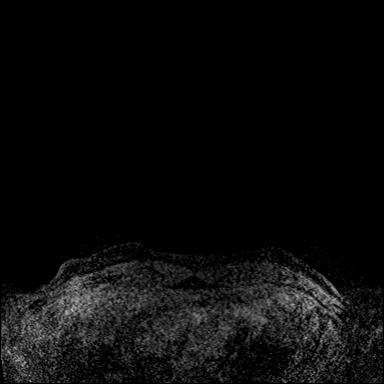
[im 59/176]
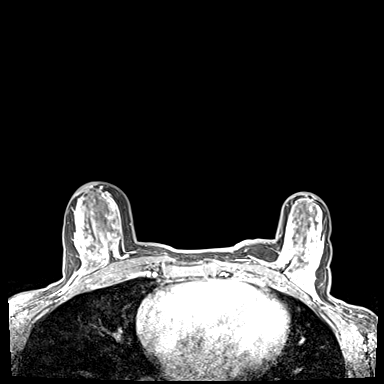
[im 117/176]
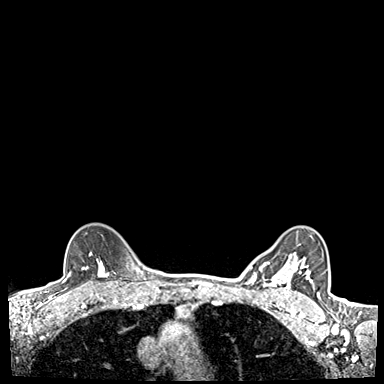
[im 176/176]
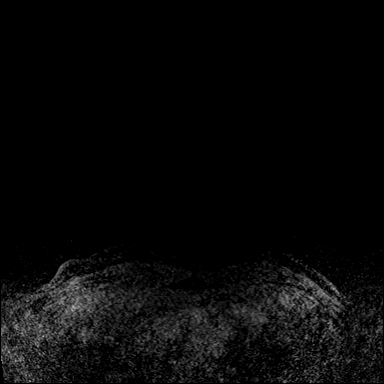

[Series 10: dynamic post 6 · axial · 1.3mm · 0.73mm/px · z∈[-82,+146]mm · 4 of 176 slices shown (2 of 2)]
[im 1/176]
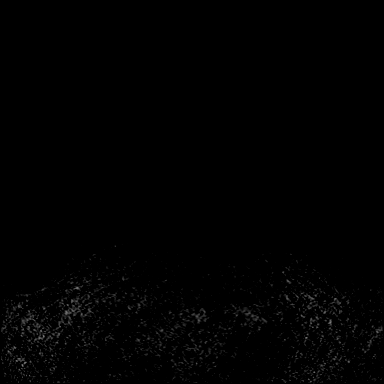
[im 59/176]
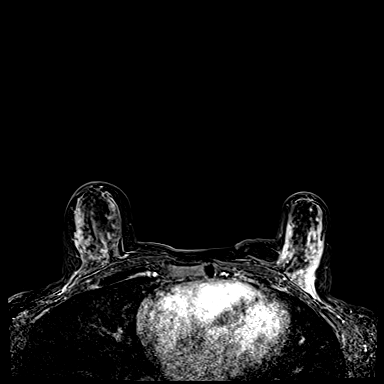
[im 117/176]
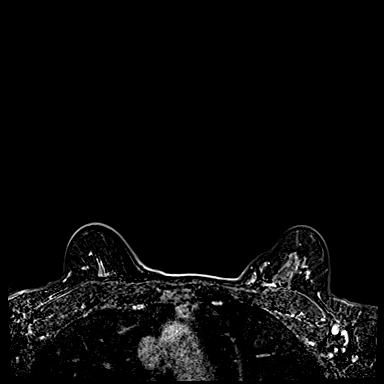
[im 176/176]
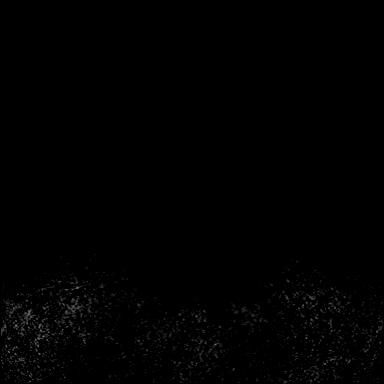

[Series 11: needle confirmation · axial · 1.3mm · 0.73mm/px · 1 of 176 slices shown]
[im 1/176]
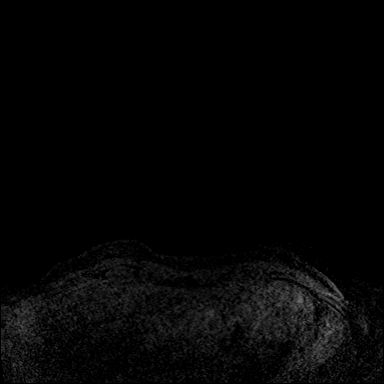

[33 of 48 positions shown; findings below may reference images not displayed]

FINDINGS: I met with the patient, and we discussed the procedure of MRI guided
biopsy, including risks, benefits, and alternatives. Specifically,
we discussed the risks of infection, bleeding, tissue injury, clip
migration, and inadequate sampling. Informed, written consent was
given. The usual time out protocol was performed immediately prior
to the procedure.

Initial pre and postcontrast magnetic resonance imaging of both
breasts demonstrated absence of the 2 previously demonstrated
irregular enhancing masses in the right breast. The 1.0 cm irregular
enhancing mass in the lower outer left breast was again visualized.

Using sterile technique, 1% Lidocaine, MRI guidance, and a 9 gauge
vacuum assisted device, biopsy was performed of the recently
demonstrated 1.0 cm irregular enhancing mass in the lower outer
quadrant of the left breast using a lateral approach. At the
conclusion of the procedure, a a cylinder shaped tissue marker clip
was deployed into the biopsy cavity. Follow-up 2-view mammogram was
performed and dictated separately.
IMPRESSION: 1. MRI guided biopsy of the recently demonstrated 1.0 cm irregular
enhancing mass in the lower outer quadrant of the left breast. No
apparent complications.
2. The 2 previously demonstrated enhancing masses in the right
breast were not visualized today. The more anterior of these was
previously biopsied and showed PASH and fibrocystic changes. The
more posterior lower outer quadrant mass had a similar appearance on
the recent MRI and most likely represents the same thing.

## 2021-10-29 IMAGING — MG MM BREAST LOCALIZATION CLIP
4 series · 4 of 12 positions shown · non-contrast
Comparison: Previous exam(s).

CLINICAL DATA: Status post MR guided core needle biopsy of a 1.0 cm
irregular enhancing mass in the lower outer quadrant of the left
breast, marked with a cylinder shaped biopsy marker clip.

EXAM:
3D DIAGNOSTIC LEFT MAMMOGRAM POST ULTRASOUND BIOPSY

[L CC synth-2D]
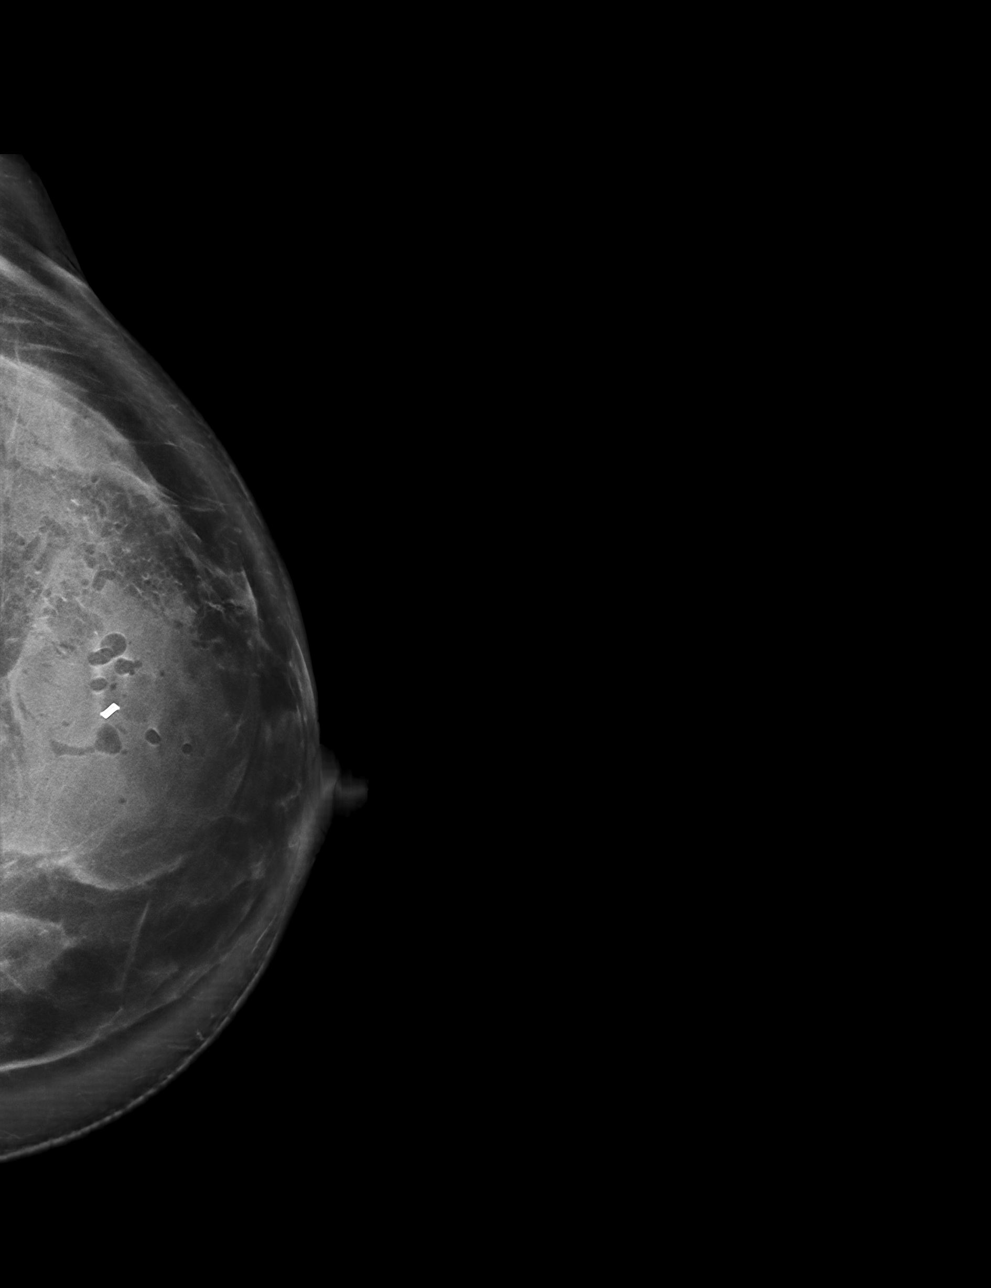

[L ML synth-2D]
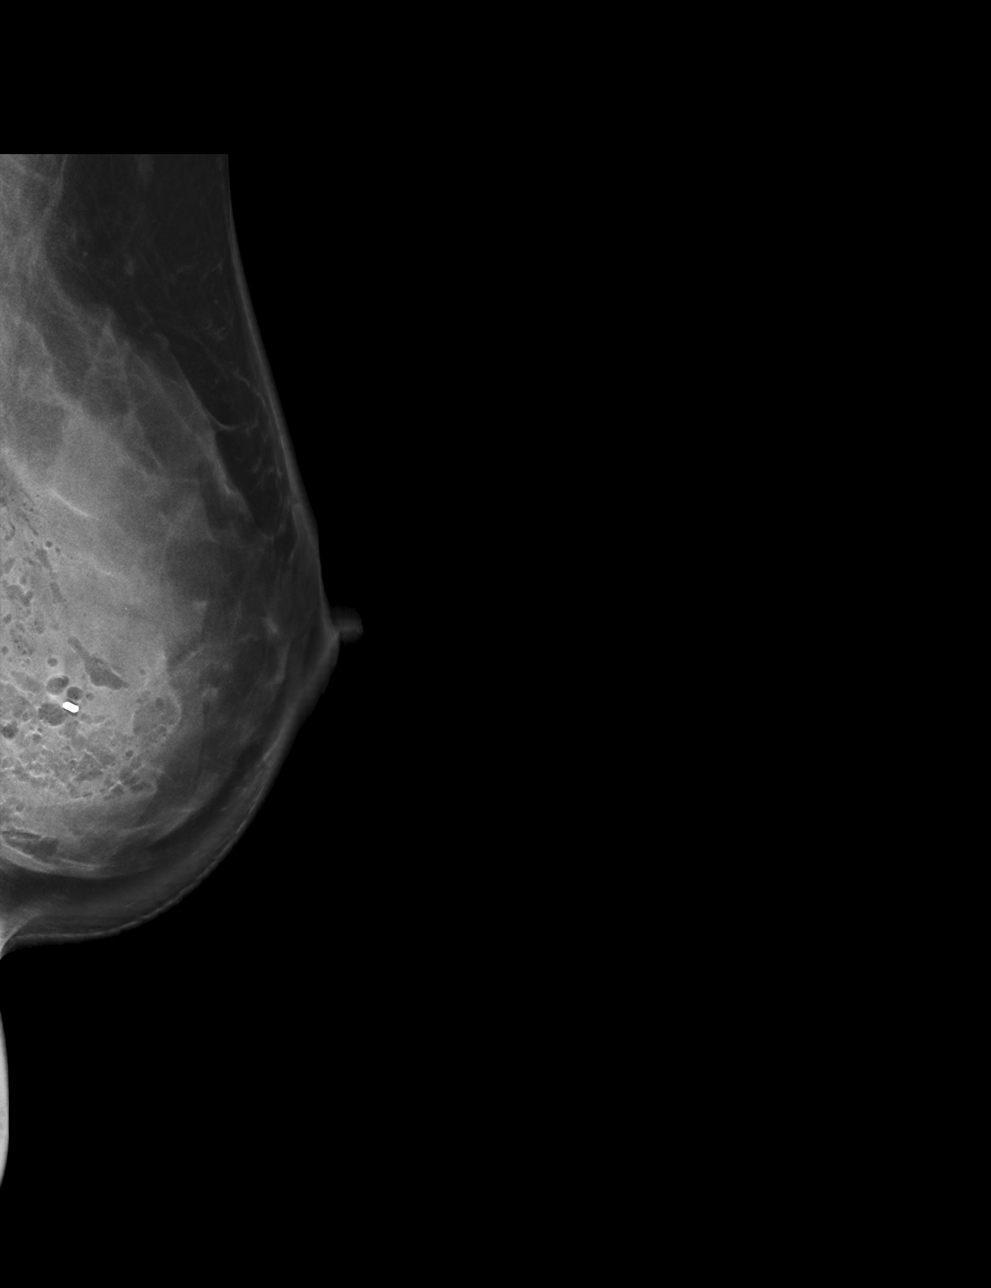

[L ML tomo · tomo slice 42/83.0]
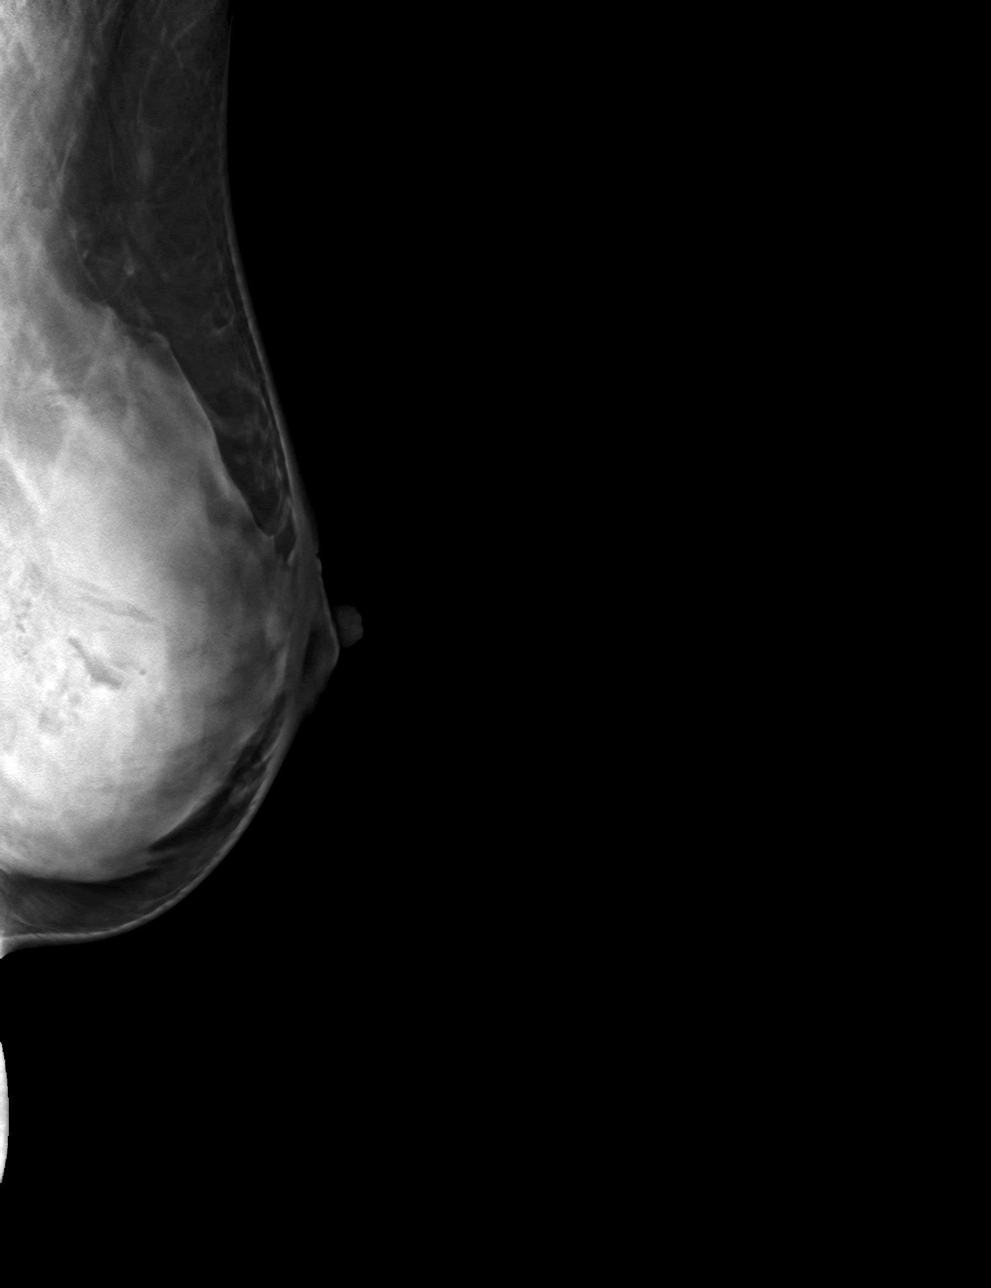

[L CC tomo · tomo slice 44/87.0]
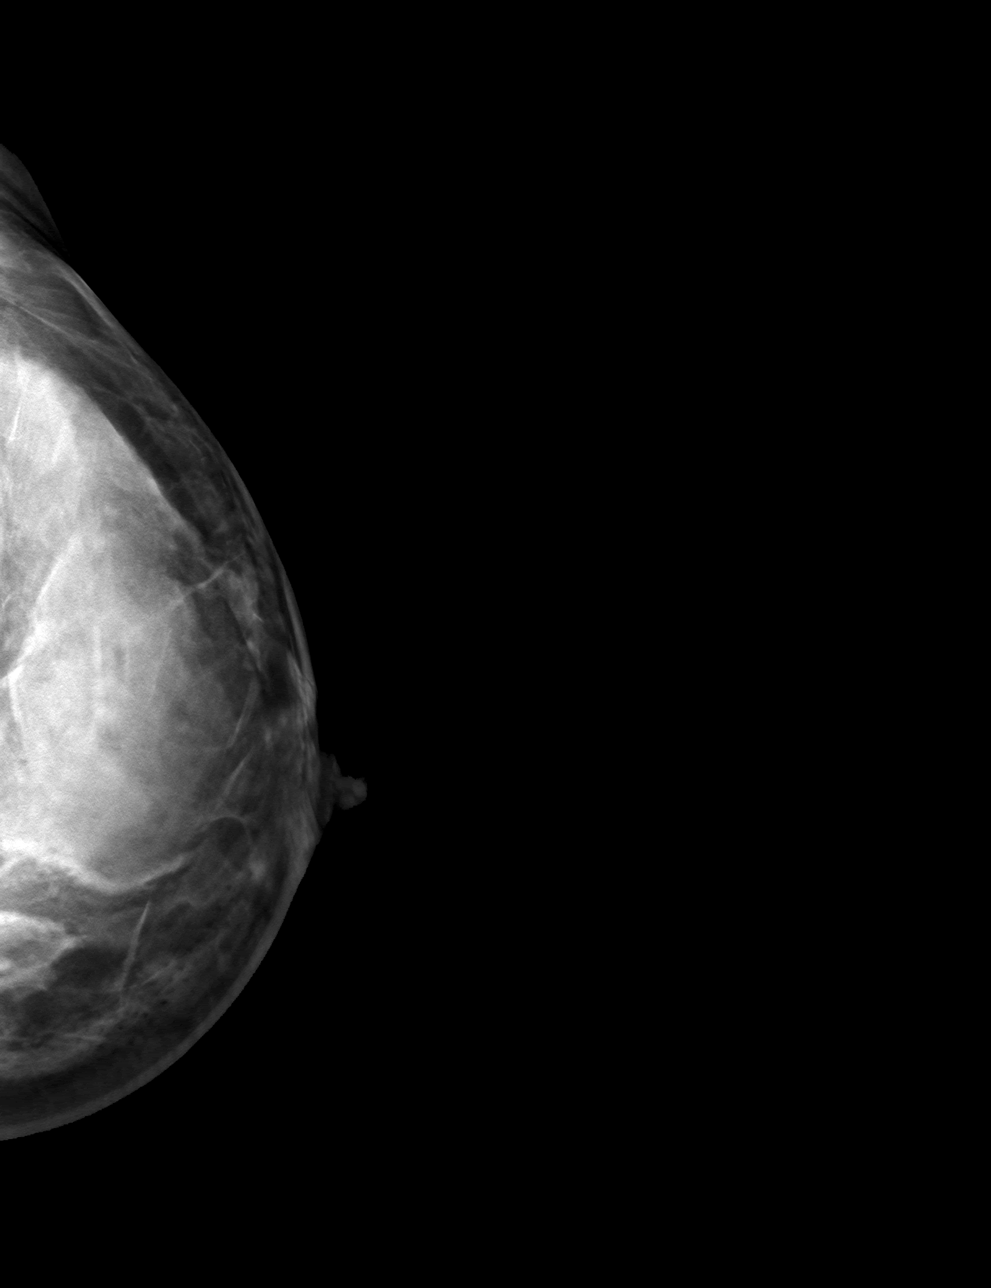

[4 of 12 positions shown; findings below may reference images not displayed]

FINDINGS: 3D Mammographic images were obtained following MR guided biopsy of
the recently demonstrated 1.0 cm irregular enhancing mass in the
lower outer quadrant of the left breast. The biopsy marking clip is
in expected position at the site of biopsy.
IMPRESSION: Appropriate positioning of the cylinder shaped biopsy marking clip
at the site of biopsy in the lower outer quadrant of the left
breast.

Final Assessment: Post Procedure Mammograms for Marker Placement

## 2021-10-29 IMAGING — MR MR BREAST BX W/ LOC DEV 1ST LEASION IMAGE BX SPEC MR GUIDE*R*
8 of 12 series · 31 of 48 positions shown · IV contrast (6 ml Multihance)
Comparison: Previous exams.
COMPARISON: Previous exams.

Addendum:
CLINICAL DATA: Two irregular enhancing masses in the right breast
on a recent MRI for diffuse right breast pain and spontaneous left
nipple discharge. One of these corresponded to a mass previously
biopsied under ultrasound guidance with benign results. There was
also a 1.0 cm irregular enhancing mass in the lower outer quadrant
of the left breast on the MRI. MR guided core needle biopsies of the
mass in the lower outer quadrant of the right breast and mass in the
lower outer quadrant of the left breast was recommended.

EXAM:
MRI GUIDED CORE NEEDLE BIOPSY OF THE LEFT BREAST
TECHNIQUE: Multiplanar, multisequence MR imaging of the both breasts was
performed both before and after administration of intravenous
contrast.
CONTRAST:  6mL GADAVIST GADOBUTROL 1 MMOL/ML IV SOLN

[Series 3: fiducial bilateral · sagittal · 2.0mm · 1.33mm/px · 4 of 128 slices shown]
[im 1/128]
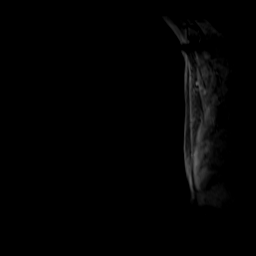
[im 43/128]
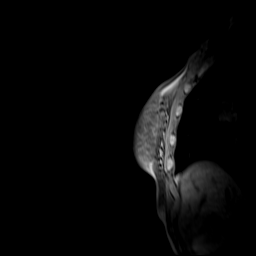
[im 85/128]
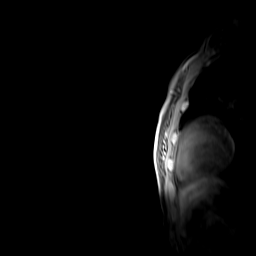
[im 128/128]
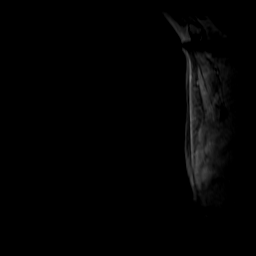

[Series 4: dynamic pre · axial · non-contrast · 1.3mm · 0.73mm/px · z∈[-82,+146]mm · 4 of 176 slices shown]
[im 1/176]
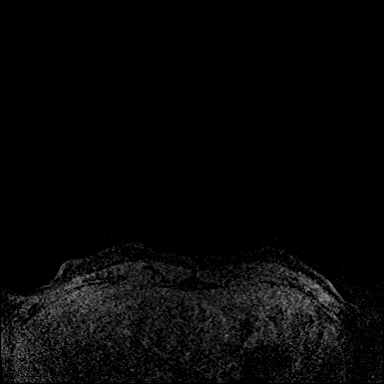
[im 59/176]
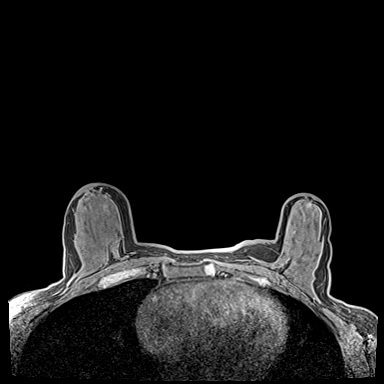
[im 117/176]
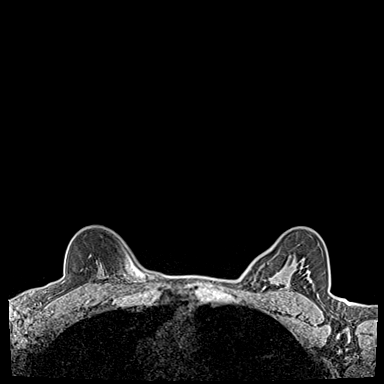
[im 176/176]
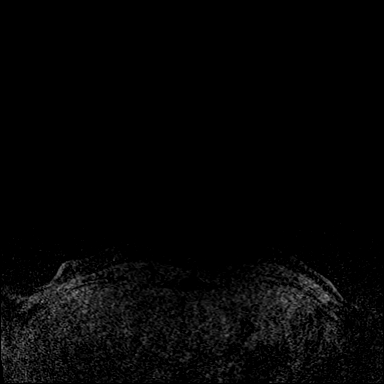

[Series 5: dynamic post 20 · axial · 1.3mm · 0.73mm/px · z∈[-82,+146]mm · 4 of 176 slices shown (1 of 2)]
[im 1/176]
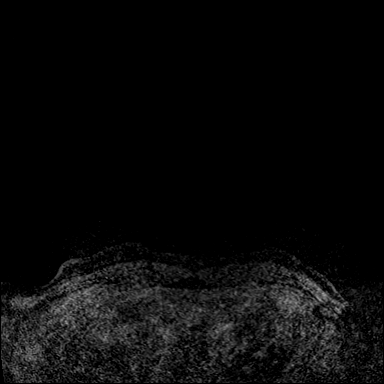
[im 59/176]
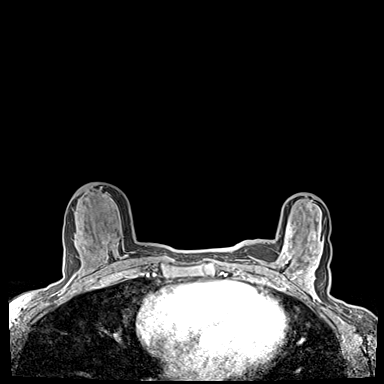
[im 117/176]
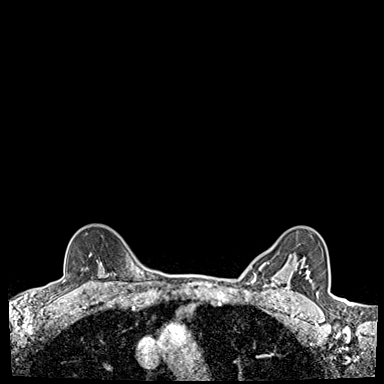
[im 176/176]
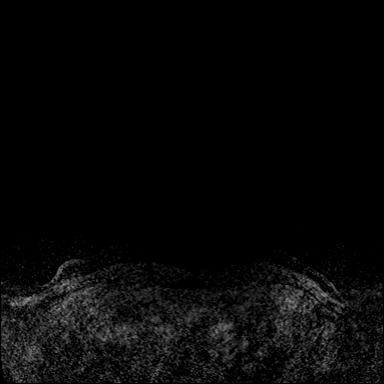

[Series 6: dynamic post 20 · axial · 1.3mm · 0.73mm/px · z∈[-82,+146]mm · 4 of 176 slices shown (2 of 2)]
[im 1/176]
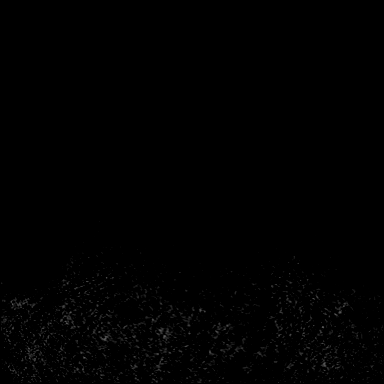
[im 59/176]
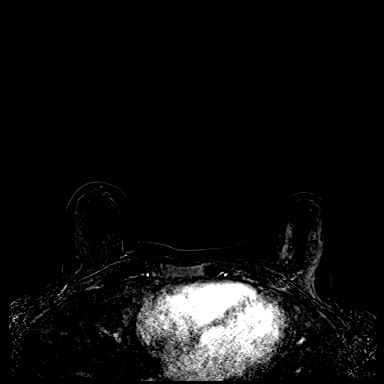
[im 117/176]
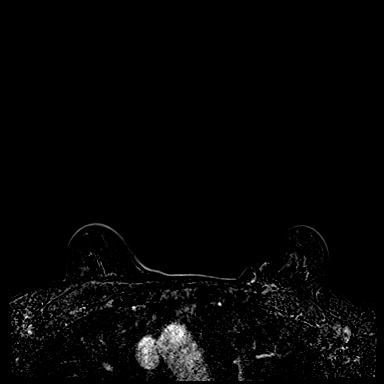
[im 176/176]
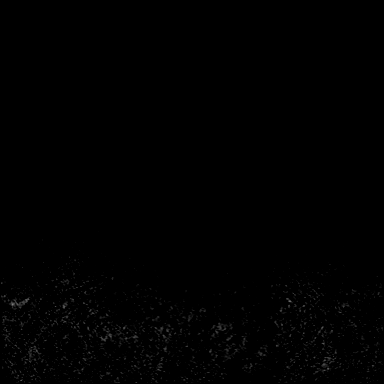

[Series 7: dynamic post 3 · axial · 1.3mm · 0.73mm/px · z∈[-82,+146]mm · 4 of 176 slices shown (1 of 2)]
[im 1/176]
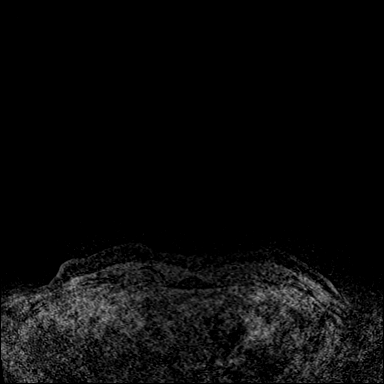
[im 59/176]
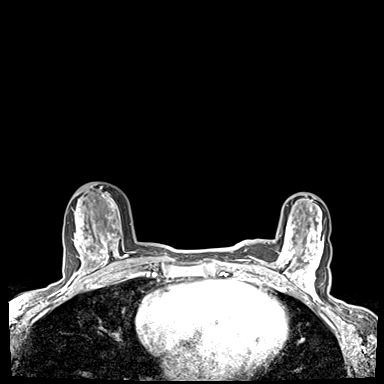
[im 117/176]
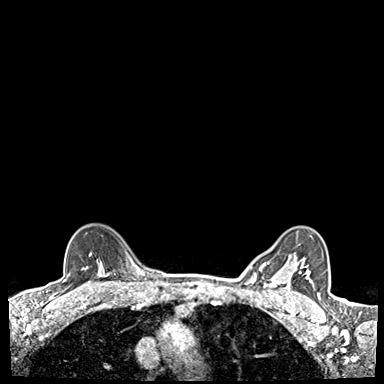
[im 176/176]
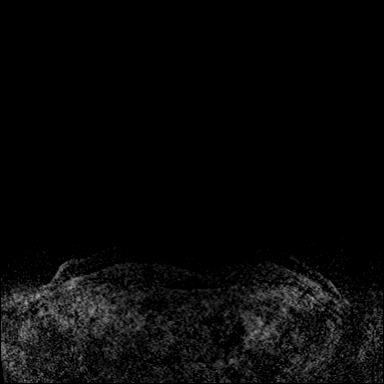

[Series 8: dynamic post 3 · axial · 1.3mm · 0.73mm/px · z∈[-82,+146]mm · 4 of 176 slices shown (2 of 2)]
[im 1/176]
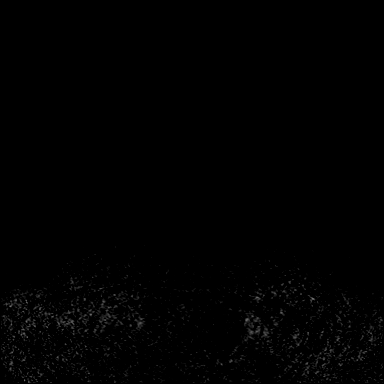
[im 59/176]
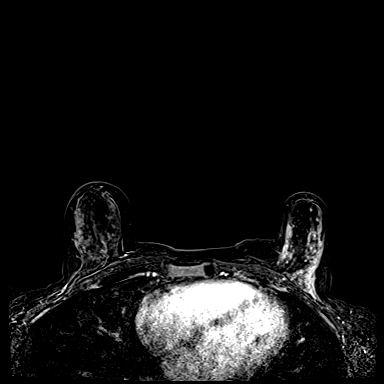
[im 117/176]
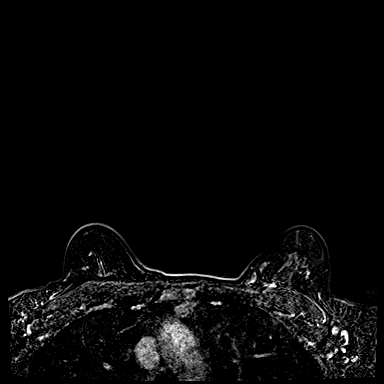
[im 176/176]
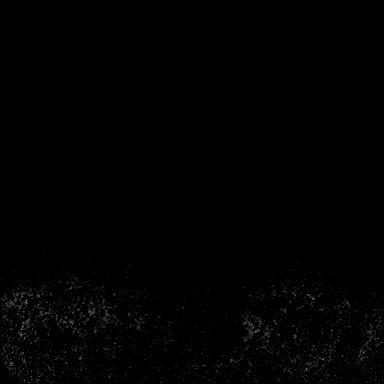

[Series 9: dynamic post 6 · axial · 1.3mm · 0.73mm/px · z∈[-82,+146]mm · 4 of 176 slices shown (1 of 2)]
[im 1/176]
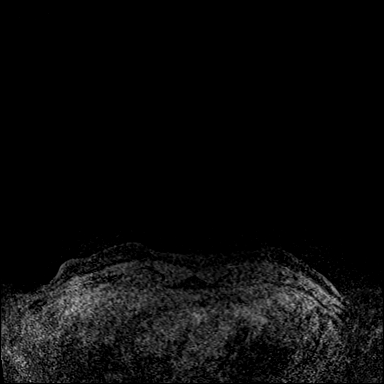
[im 59/176]
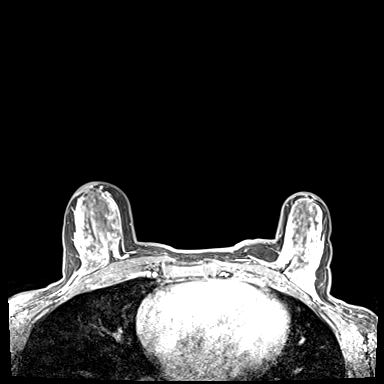
[im 117/176]
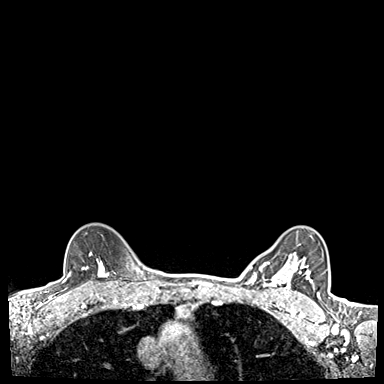
[im 176/176]
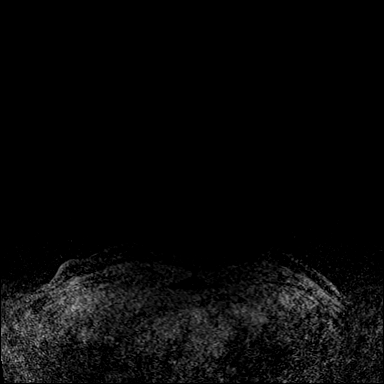

[Series 10: dynamic post 6 · axial · 1.3mm · 0.73mm/px · z∈[-82,+69]mm · 3 of 176 slices shown (2 of 2)]
[im 1/176]
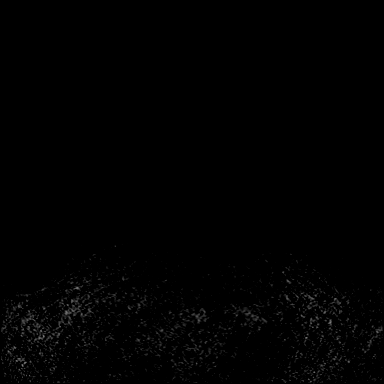
[im 59/176]
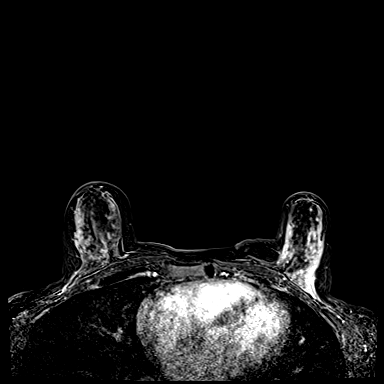
[im 117/176]
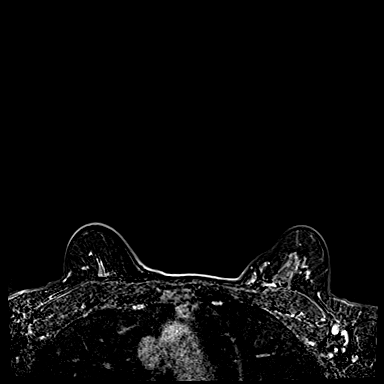

[31 of 48 positions shown; findings below may reference images not displayed]

FINDINGS: I met with the patient, and we discussed the procedure of MRI guided
biopsy, including risks, benefits, and alternatives. Specifically,
we discussed the risks of infection, bleeding, tissue injury, clip
migration, and inadequate sampling. Informed, written consent was
given. The usual time out protocol was performed immediately prior
to the procedure.

Initial pre and postcontrast magnetic resonance imaging of both
breasts demonstrated absence of the 2 previously demonstrated
irregular enhancing masses in the right breast. The 1.0 cm irregular
enhancing mass in the lower outer left breast was again visualized.

Using sterile technique, 1% Lidocaine, MRI guidance, and a 9 gauge
vacuum assisted device, biopsy was performed of the recently
demonstrated 1.0 cm irregular enhancing mass in the lower outer
quadrant of the left breast using a lateral approach. At the
conclusion of the procedure, a a cylinder shaped tissue marker clip
was deployed into the biopsy cavity. Follow-up 2-view mammogram was
performed and dictated separately.
IMPRESSION: 1. MRI guided biopsy of the recently demonstrated 1.0 cm irregular
enhancing mass in the lower outer quadrant of the left breast. No
apparent complications.
2. The 2 previously demonstrated enhancing masses in the right
breast were not visualized today. The more anterior of these was
previously biopsied and showed PASH and fibrocystic changes. The
more posterior lower outer quadrant mass had a similar appearance on
the recent MRI and most likely represents the same thing.

ADDENDUM:
Pathology revealed FIBROCYSTIC CHANGES AND PSEUDOANGIOMATOUS STROMAL
HYPERPLASIA of the LEFT breast, lower outer quadrant, (cylinder
clip). This was found to be concordant by Dr. OXENDINE.

Pathology results were discussed with the patient by telephone. The
patient reported doing well after the biopsy with tenderness at the
site. Post biopsy instructions and care were reviewed and questions
were answered. The patient was encouraged to call The [REDACTED] for any additional concerns. My direct phone
number was provided.

The patient was instructed to return for a BILATERAL breast MRI in 6
months, per protocol, and to evaluate the TWO irregular enhancing
masses in the RIGHT breast, not seen at the time of LEFT MRI biopsy
on [DATE]. It is recommended that the follow-up MRI be
performed 1 week after menstrual period.

Pathology results reported by OXENDINE, RN on [DATE].

*** End of Addendum ***
FINDINGS: I met with the patient, and we discussed the procedure of MRI guided
biopsy, including risks, benefits, and alternatives. Specifically,
we discussed the risks of infection, bleeding, tissue injury, clip
migration, and inadequate sampling. Informed, written consent was
given. The usual time out protocol was performed immediately prior
to the procedure.

Initial pre and postcontrast magnetic resonance imaging of both
breasts demonstrated absence of the 2 previously demonstrated
irregular enhancing masses in the right breast. The 1.0 cm irregular
enhancing mass in the lower outer left breast was again visualized.

Using sterile technique, 1% Lidocaine, MRI guidance, and a 9 gauge
vacuum assisted device, biopsy was performed of the recently
demonstrated 1.0 cm irregular enhancing mass in the lower outer
quadrant of the left breast using a lateral approach. At the
conclusion of the procedure, a a cylinder shaped tissue marker clip
was deployed into the biopsy cavity. Follow-up 2-view mammogram was
performed and dictated separately.
IMPRESSION: 1. MRI guided biopsy of the recently demonstrated 1.0 cm irregular
enhancing mass in the lower outer quadrant of the left breast. No
apparent complications.
2. The 2 previously demonstrated enhancing masses in the right
breast were not visualized today. The more anterior of these was
previously biopsied and showed PASH and fibrocystic changes. The
more posterior lower outer quadrant mass had a similar appearance on
the recent MRI and most likely represents the same thing.

## 2021-10-29 MED ORDER — GADOBUTROL 1 MMOL/ML IV SOLN
6.0000 mL | Freq: Once | INTRAVENOUS | Status: AC | PRN
  Administered 2021-10-29: 6 mL via INTRAVENOUS

## 2022-01-01 ENCOUNTER — Encounter: Payer: Self-pay | Admitting: Physician Assistant

## 2022-01-01 ENCOUNTER — Encounter: Payer: Self-pay | Admitting: Nurse Practitioner

## 2022-01-01 ENCOUNTER — Other Ambulatory Visit: Payer: Self-pay

## 2022-01-01 ENCOUNTER — Ambulatory Visit (INDEPENDENT_AMBULATORY_CARE_PROVIDER_SITE_OTHER): Payer: Managed Care, Other (non HMO) | Admitting: Nurse Practitioner

## 2022-01-01 VITALS — BP 108/78 | HR 73 | Temp 98.3°F | Ht 65.0 in | Wt 134.0 lb

## 2022-01-01 DIAGNOSIS — D2370 Other benign neoplasm of skin of unspecified lower limb, including hip: Secondary | ICD-10-CM | POA: Insufficient documentation

## 2022-01-01 DIAGNOSIS — Z131 Encounter for screening for diabetes mellitus: Secondary | ICD-10-CM

## 2022-01-01 DIAGNOSIS — Z1159 Encounter for screening for other viral diseases: Secondary | ICD-10-CM | POA: Diagnosis not present

## 2022-01-01 DIAGNOSIS — D225 Melanocytic nevi of trunk: Secondary | ICD-10-CM | POA: Insufficient documentation

## 2022-01-01 DIAGNOSIS — Z13220 Encounter for screening for lipoid disorders: Secondary | ICD-10-CM | POA: Diagnosis not present

## 2022-01-01 DIAGNOSIS — Z1329 Encounter for screening for other suspected endocrine disorder: Secondary | ICD-10-CM | POA: Diagnosis not present

## 2022-01-01 DIAGNOSIS — K644 Residual hemorrhoidal skin tags: Secondary | ICD-10-CM

## 2022-01-01 DIAGNOSIS — L814 Other melanin hyperpigmentation: Secondary | ICD-10-CM | POA: Insufficient documentation

## 2022-01-01 DIAGNOSIS — Z87898 Personal history of other specified conditions: Secondary | ICD-10-CM | POA: Insufficient documentation

## 2022-01-01 DIAGNOSIS — Z0000 Encounter for general adult medical examination without abnormal findings: Secondary | ICD-10-CM

## 2022-01-01 LAB — CBC WITH DIFFERENTIAL/PLATELET
Basophils Absolute: 0.1 10*3/uL (ref 0.0–0.1)
Basophils Relative: 1.2 % (ref 0.0–3.0)
Eosinophils Absolute: 0.2 10*3/uL (ref 0.0–0.7)
Eosinophils Relative: 2.5 % (ref 0.0–5.0)
HCT: 36.7 % (ref 36.0–46.0)
Hemoglobin: 12.2 g/dL (ref 12.0–15.0)
Lymphocytes Relative: 30.6 % (ref 12.0–46.0)
Lymphs Abs: 2 10*3/uL (ref 0.7–4.0)
MCHC: 33.4 g/dL (ref 30.0–36.0)
MCV: 85.5 fl (ref 78.0–100.0)
Monocytes Absolute: 0.4 10*3/uL (ref 0.1–1.0)
Monocytes Relative: 6.6 % (ref 3.0–12.0)
Neutro Abs: 3.8 10*3/uL (ref 1.4–7.7)
Neutrophils Relative %: 59.1 % (ref 43.0–77.0)
Platelets: 209 10*3/uL (ref 150.0–400.0)
RBC: 4.29 Mil/uL (ref 3.87–5.11)
RDW: 13.3 % (ref 11.5–15.5)
WBC: 6.5 10*3/uL (ref 4.0–10.5)

## 2022-01-01 LAB — COMPREHENSIVE METABOLIC PANEL
ALT: 8 U/L (ref 0–35)
AST: 13 U/L (ref 0–37)
Albumin: 4.4 g/dL (ref 3.5–5.2)
Alkaline Phosphatase: 30 U/L — ABNORMAL LOW (ref 39–117)
BUN: 18 mg/dL (ref 6–23)
CO2: 26 mEq/L (ref 19–32)
Calcium: 9 mg/dL (ref 8.4–10.5)
Chloride: 105 mEq/L (ref 96–112)
Creatinine, Ser: 0.68 mg/dL (ref 0.40–1.20)
GFR: 109.48 mL/min (ref >60.00)
Glucose, Bld: 80 mg/dL (ref 70–99)
Potassium: 4.1 mEq/L (ref 3.5–5.1)
Sodium: 139 mEq/L (ref 135–145)
Total Bilirubin: 0.4 mg/dL (ref 0.2–1.2)
Total Protein: 6.7 g/dL (ref 6.0–8.3)

## 2022-01-01 LAB — LIPID PANEL
Cholesterol: 167 mg/dL (ref 0–200)
HDL: 66 mg/dL (ref >39.00)
LDL Cholesterol: 93 mg/dL (ref 0–99)
NonHDL: 101.26
Total CHOL/HDL Ratio: 3
Triglycerides: 39 mg/dL (ref 0.0–149.0)
VLDL: 7.8 mg/dL (ref 0.0–40.0)

## 2022-01-01 LAB — HEMOGLOBIN A1C: Hgb A1c MFr Bld: 5.2 % (ref 4.6–6.5)

## 2022-01-01 LAB — TSH: TSH: 3.3 u[IU]/mL (ref 0.35–5.50)

## 2022-01-01 NOTE — Progress Notes (Signed)
Subjective:  Patient ID: Suzanne Mclaughlin, female    DOB: 04-Dec-1981  Age: 41 y.o. MRN: [id number]  CC:  Chief Complaint  Patient presents with   New Patient (Initial Visit)      HPI  This patient arrives today for the above.  She used to see Dr. Sharlet Salina for her PCP, however she has not been seen at this practice or by Dr. Sharlet Salina for over 3 years.  She is reestablishing care today for routine medical care.  She was being regularly seen by her OB/GYN prior to coming back here today.  She reports intermittent bleeding in stool.  She tells me she has an external hemorrhoid and feels that the bleeding comes from this but would like to discuss possibly having the hemorrhoid removed.  She does have a history of melanoma and tells me she sees her dermatologist every 6 months.   Past Medical History:  Diagnosis Date   Cancer ([place])    melanoma   GERD (gastroesophageal reflux disease)    Postpartum care following vaginal delivery (12/15) 12/03/2016   Vaginal Pap smear, abnormal       Family History  Problem Relation Age of Onset   Hyperlipidemia Mother    Hypertension Mother    Cancer Maternal Aunt     Social History   Social History Narrative   Not on file   Social History   Tobacco Use   Smoking status: Never   Smokeless tobacco: Never  Substance Use Topics   Alcohol use: No    Alcohol/week: 0.0 standard drinks     No outpatient medications have been marked as taking for the 01/01/22 encounter (Office Visit) with Ailene Ards, NP.    ROS:  Review of Systems  Constitutional:  Negative for fever and weight loss.  Respiratory:  Negative for shortness of breath.   Cardiovascular:  Negative for chest pain.  Gastrointestinal:  Positive for blood in stool and constipation. Negative for abdominal pain.  Neurological:  Negative for loss of consciousness, weakness and headaches.    Objective:   Today's Vitals: BP 108/78 (BP Location: Left Arm, Patient Position:  Sitting, Cuff Size: Normal)    Pulse 73    Temp 98.3 F (36.8 C) (Oral)    Ht 5\' 5"  (1.651 m)    Wt 134 lb (60.8 kg)    SpO2 99%    BMI 22.30 kg/m  Vitals with BMI 01/01/2022 02/26/2019 02/25/2019  Height 5\' 5"  - -  Weight 134 lbs - -  BMI 37.1 - -  Systolic 062 96 694  Diastolic 78 68 67  Pulse 73 67 60     Physical Exam Vitals reviewed.  Constitutional:      General: She is not in acute distress.    Appearance: Normal appearance.  HENT:     Head: Normocephalic and atraumatic.  Neck:     Vascular: No carotid bruit.  Cardiovascular:     Rate and Rhythm: Normal rate and regular rhythm.     Pulses: Normal pulses.     Heart sounds: Normal heart sounds.  Pulmonary:     Effort: Pulmonary effort is normal.     Breath sounds: Normal breath sounds.  Skin:    General: Skin is warm and dry.  Neurological:     General: No focal deficit present.     Mental Status: She is alert and oriented to person, place, and time.  Psychiatric:        Mood and  Affect: Mood normal.        Behavior: Behavior normal.        Judgment: Judgment normal.         Assessment and Plan   1. Screening for thyroid disorder   2. Screening for diabetes mellitus   3. Screening for lipid disorders   4. Health maintenance examination   5. Encounter for hepatitis C screening test for low risk patient   6. External hemorrhoid, bleeding      Plan: 1.-5.  We will check routine blood work today for further evaluation. 6.  Referral to gastroenterology made today.    Tests ordered Orders Placed This Encounter  Procedures   TSH   Hemoglobin A1c   Lipid panel   Comprehensive metabolic panel   CBC with Differential/Platelet   Hepatitis C Antibody   Ambulatory referral to Gastroenterology      No orders of the defined types were placed in this encounter.   Patient to follow-up in 1 to 3 months for comprehensive physical exam.  Ailene Ards, NP

## 2022-01-08 ENCOUNTER — Ambulatory Visit
Admission: RE | Admit: 2022-01-08 | Discharge: 2022-01-08 | Disposition: A | Discharge status: Home / Self Care | Payer: Managed Care, Other (non HMO) | Source: Ambulatory Visit | Attending: Obstetrics and Gynecology | Admitting: Obstetrics and Gynecology

## 2022-01-08 ENCOUNTER — Other Ambulatory Visit: Payer: Self-pay | Admitting: Obstetrics and Gynecology

## 2022-01-08 DIAGNOSIS — N6320 Unspecified lump in the left breast, unspecified quadrant: Secondary | ICD-10-CM

## 2022-01-08 IMAGING — MG MM DIGITAL DIAGNOSTIC UNILAT*L* W/ TOMO W/ CAD
2 series · 2 of 6 positions shown · non-contrast
Comparison: Previous exam(s).

CLINICAL DATA: 40-year-old who had a benign MRI guided core needle
biopsy of the LOWER OUTER QUADRANT of the LEFT breast in [DATE], pathology demonstrating fibrocystic changes and PASH. She
presented to her primary provider yesterday with acute onset of pain
and tenderness in the outer LEFT breast. She is now on day 2 of
doxycycline. She presents for imaging in order to exclude an
abscess.

EXAM:
DIGITAL DIAGNOSTIC UNILATERAL LEFT MAMMOGRAM WITH TOMOSYNTHESIS AND
CAD; ULTRASOUND LEFT BREAST LIMITED
TECHNIQUE: Left digital diagnostic mammography and breast tomosynthesis was
performed. The images were evaluated with computer-aided detection.;
Targeted ultrasound examination of the left breast was performed.

[L CC synth-2D]
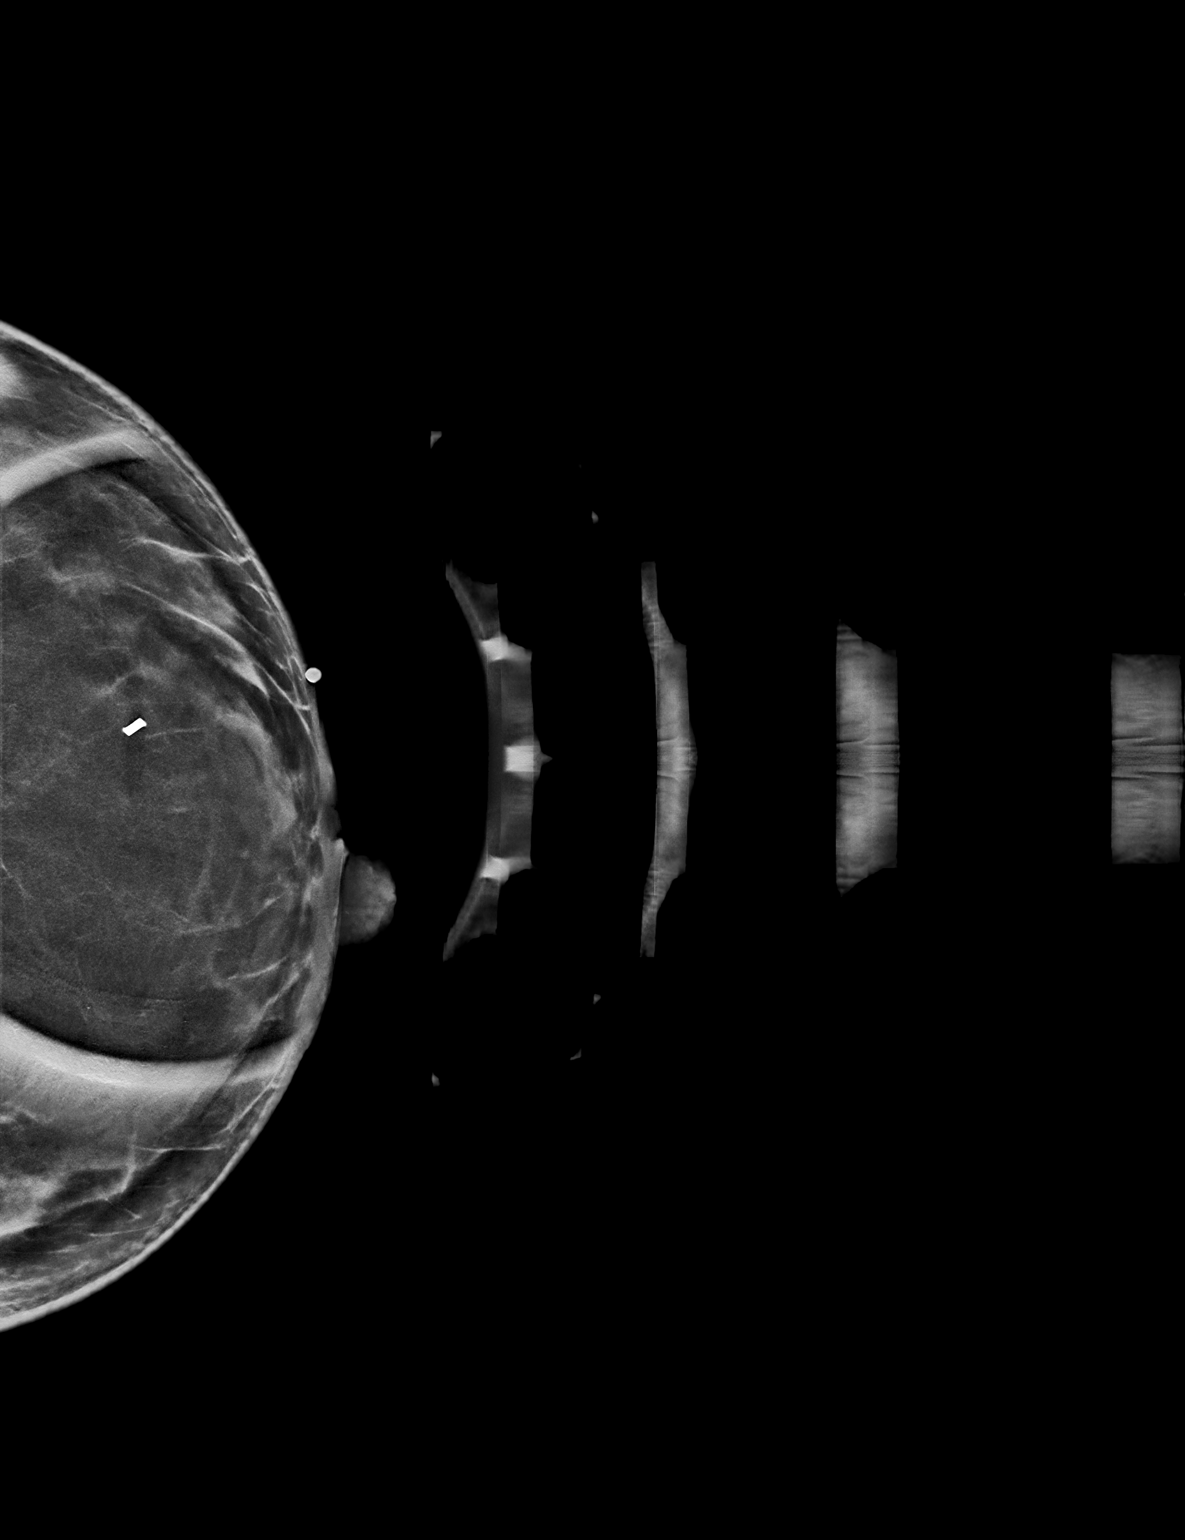

[L CC tomo · tomo slice 32/63.0]
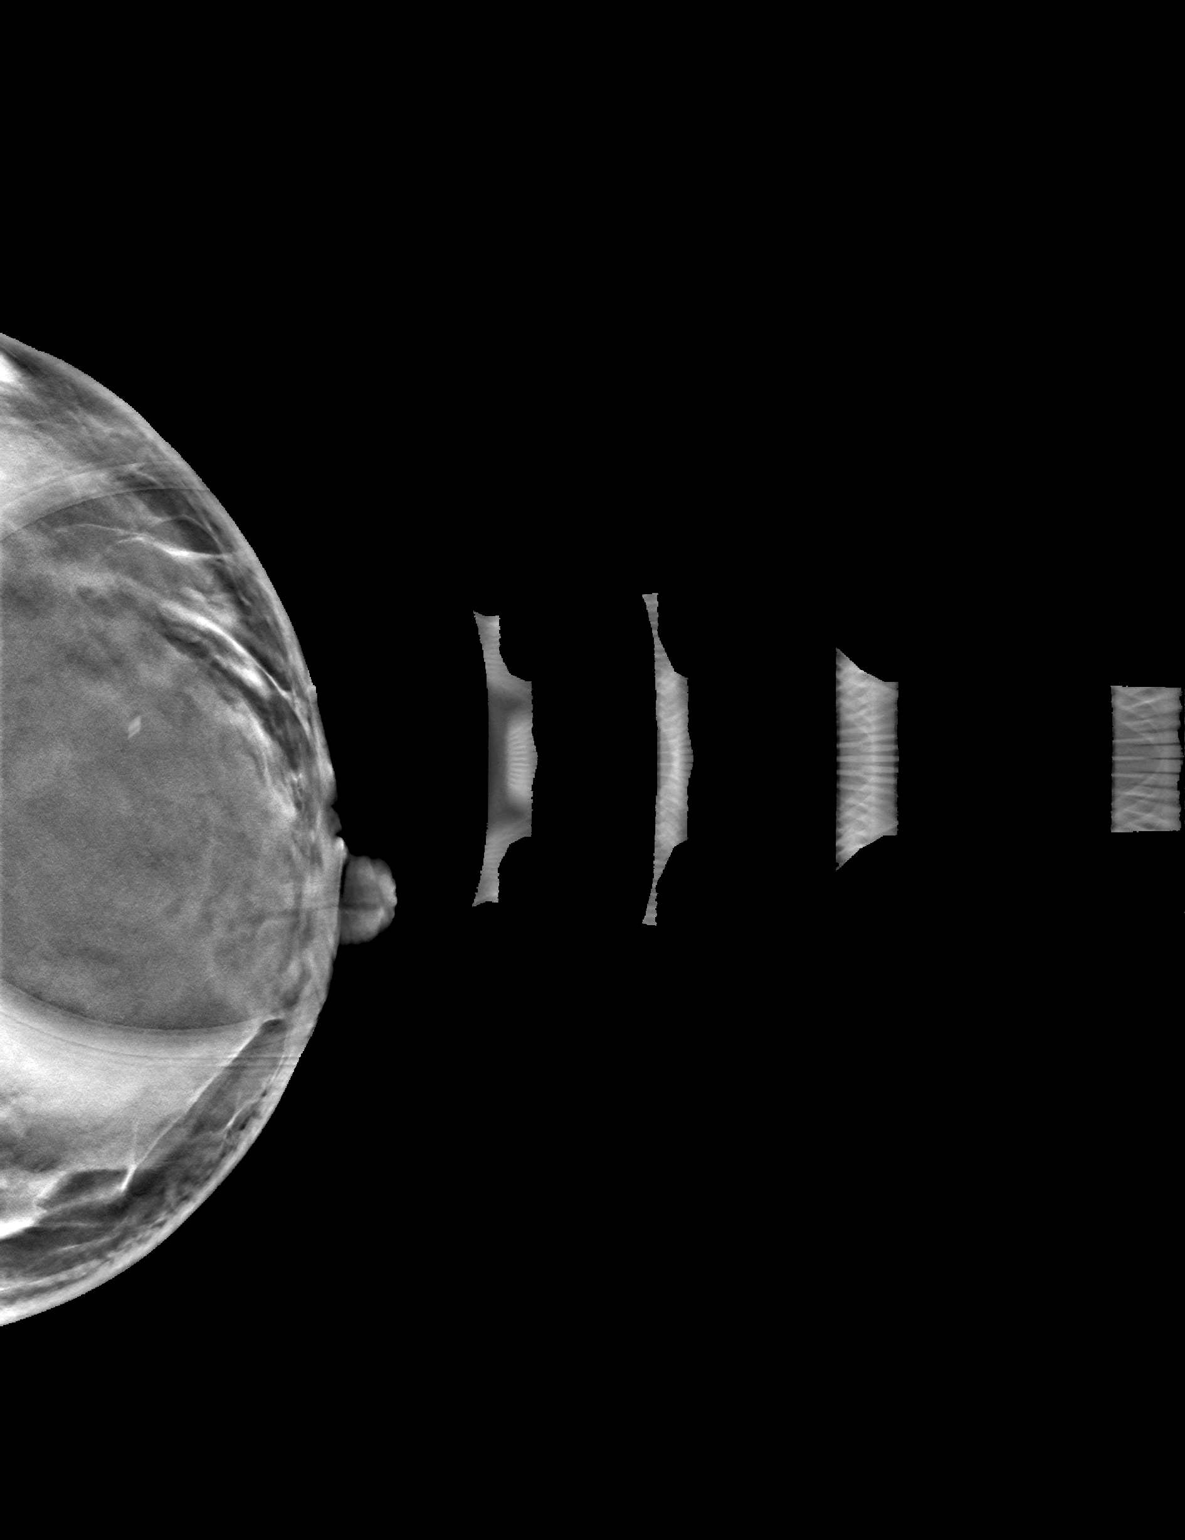

[2 of 6 positions shown; findings below may reference images not displayed]

ACR Breast Density Category d: The breast tissue is extremely dense,
which lowers the sensitivity of mammography.
FINDINGS: Initially, targeted ultrasound was performed in the area of pain and
palpable thickening, demonstrating a heterogeneous mass at the 4
o'clock position approximately 1 cm from nipple at middle depth,
measuring in total approximately 4.0 x 1.7 x 1.6 cm. A hyperechoic
focus is present within the mass, felt to most likely represent the
cylinder shaped tissue marking clip placed at the time of the MRI
guided biopsy. There is hyperemia in this location on power Doppler
evaluation. There is no drainable fluid collection. There is
possible distortion at the periphery of the mass.

Subsequently, a BB was placed over this site and a spot tangential
mammogram was performed. The mammogram confirms that the hyperechoic
focus within the mass is the cylinder shaped tissue marking clip.
There is no visible mass or architectural distortion on the spot
tangential mammogram.

On correlative physical examination, the outer periareolar LEFT
breast is exquisitely tender to palpation. There is palpable
thickening/induration in this location. There may be minimal skin
erythema in the lower outer periareolar location.
IMPRESSION: 1. No evidence of LEFT breast abscess.
2. Likely cellulitis and inflammation involving the LOWER OUTER
QUADRANT of the LEFT breast in the area of pain and palpable
thickening. The biopsy-proven fibrocystic changes and PASH are
present in this location as the cylinder shaped tissue marking clip
placed at the time of biopsy is within the mass.
3. Questionable distortion at the periphery of the mass on
ultrasound, without evidence of architectural distortion on
mammography or on the prior MRI. This is likely related to post
biopsy change.

RECOMMENDATION:
1. Follow-up LEFT breast ultrasound in 2 weeks after completion of
antibiotic therapy. This has been scheduled by the Breast Imaging
staff.
2. If the palpable thickening persists after antibiotic therapy,
particularly if there is still questionable distortion on the
follow-up ultrasound, then ultrasound-guided core needle biopsy
would be recommended.

I have discussed the findings and recommendations with the patient.
If applicable, a reminder letter will be sent to the patient
regarding the next appointment.

BI-RADS CATEGORY  3: Probably benign.

## 2022-01-08 IMAGING — US US BREAST*L* LIMITED INC AXILLA
1 series · 6 of 6 positions shown · non-contrast
Comparison: Previous exam(s).

CLINICAL DATA: 40-year-old who had a benign MRI guided core needle
biopsy of the LOWER OUTER QUADRANT of the LEFT breast in [DATE], pathology demonstrating fibrocystic changes and PASH. She
presented to her primary provider yesterday with acute onset of pain
and tenderness in the outer LEFT breast. She is now on day 2 of
doxycycline. She presents for imaging in order to exclude an
abscess.

EXAM:
DIGITAL DIAGNOSTIC UNILATERAL LEFT MAMMOGRAM WITH TOMOSYNTHESIS AND
CAD; ULTRASOUND LEFT BREAST LIMITED
TECHNIQUE: Left digital diagnostic mammography and breast tomosynthesis was
performed. The images were evaluated with computer-aided detection.;
Targeted ultrasound examination of the left breast was performed.

[Series 1: us breast*left* limited inc axilla · 0.07mm/px · 6 of 6 slices shown]
[im 1/6]
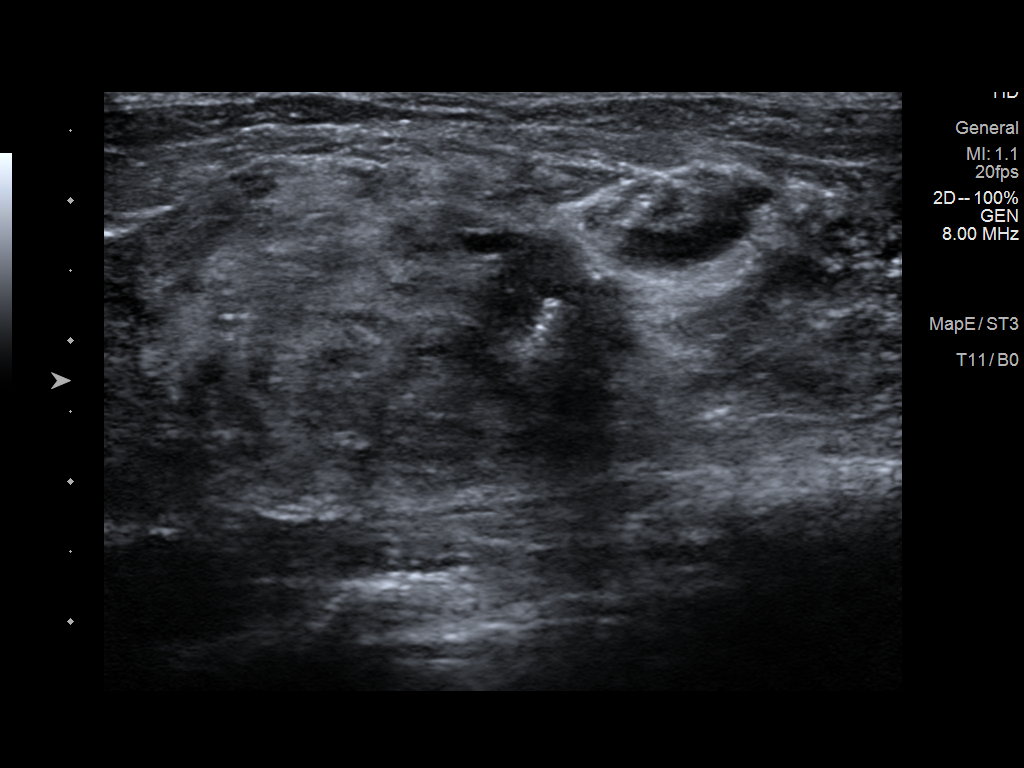
[im 2/6]
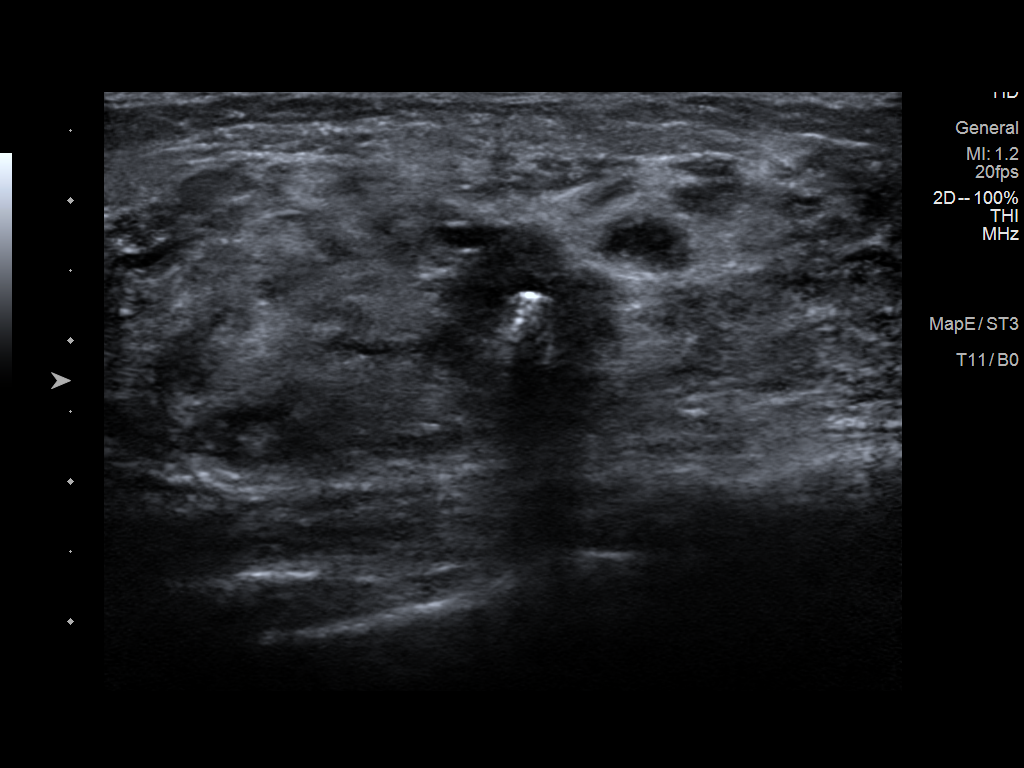
[im 3/6]
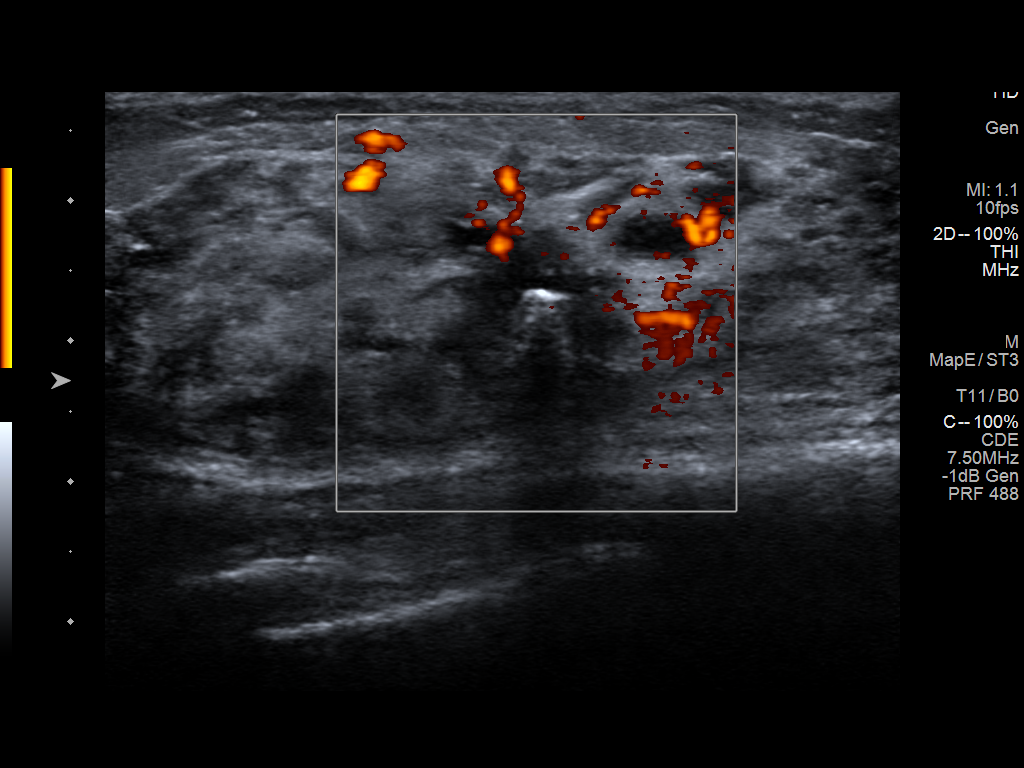
[im 4/6]
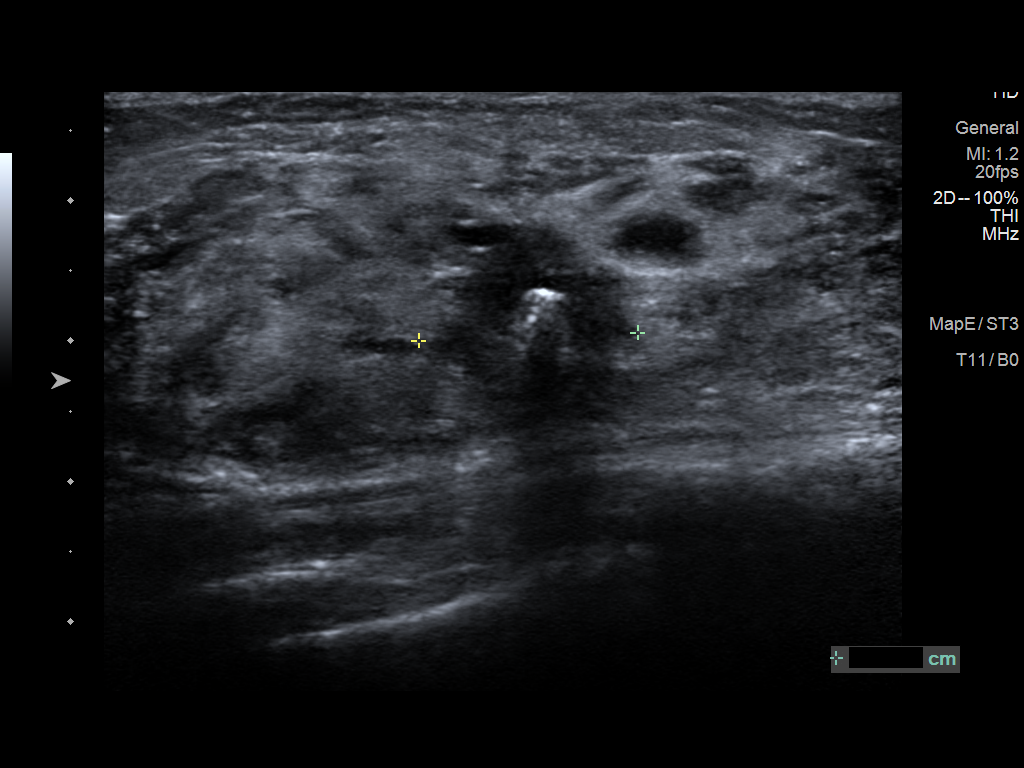
[im 5/6]
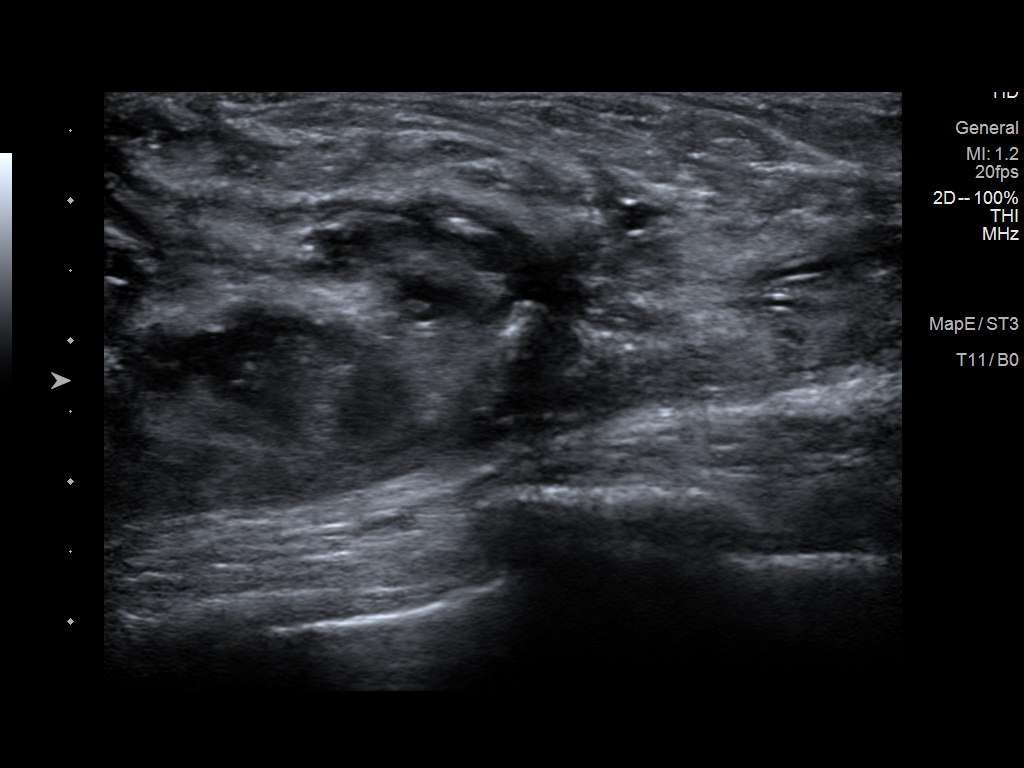
[im 6/6]
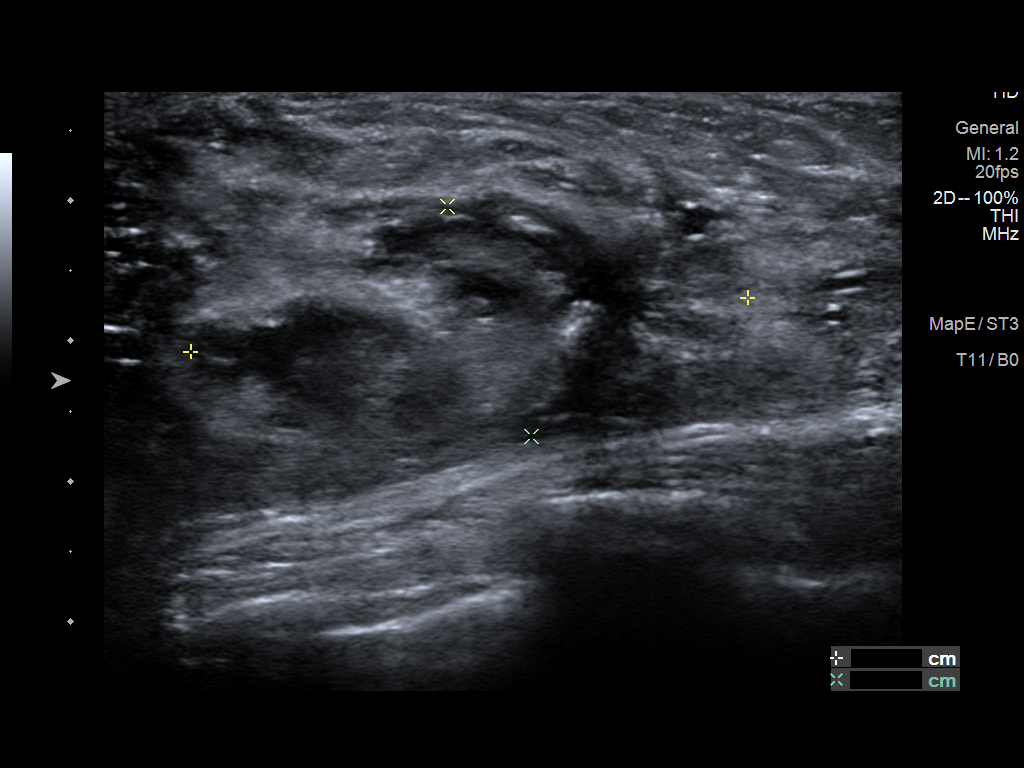

[6 of 6 positions shown; findings below may reference images not displayed]

ACR Breast Density Category d: The breast tissue is extremely dense,
which lowers the sensitivity of mammography.
FINDINGS: Initially, targeted ultrasound was performed in the area of pain and
palpable thickening, demonstrating a heterogeneous mass at the 4
o'clock position approximately 1 cm from nipple at middle depth,
measuring in total approximately 4.0 x 1.7 x 1.6 cm. A hyperechoic
focus is present within the mass, felt to most likely represent the
cylinder shaped tissue marking clip placed at the time of the MRI
guided biopsy. There is hyperemia in this location on power Doppler
evaluation. There is no drainable fluid collection. There is
possible distortion at the periphery of the mass.

Subsequently, a BB was placed over this site and a spot tangential
mammogram was performed. The mammogram confirms that the hyperechoic
focus within the mass is the cylinder shaped tissue marking clip.
There is no visible mass or architectural distortion on the spot
tangential mammogram.

On correlative physical examination, the outer periareolar LEFT
breast is exquisitely tender to palpation. There is palpable
thickening/induration in this location. There may be minimal skin
erythema in the lower outer periareolar location.
IMPRESSION: 1. No evidence of LEFT breast abscess.
2. Likely cellulitis and inflammation involving the LOWER OUTER
QUADRANT of the LEFT breast in the area of pain and palpable
thickening. The biopsy-proven fibrocystic changes and PASH are
present in this location as the cylinder shaped tissue marking clip
placed at the time of biopsy is within the mass.
3. Questionable distortion at the periphery of the mass on
ultrasound, without evidence of architectural distortion on
mammography or on the prior MRI. This is likely related to post
biopsy change.

RECOMMENDATION:
1. Follow-up LEFT breast ultrasound in 2 weeks after completion of
antibiotic therapy. This has been scheduled by the Breast Imaging
staff.
2. If the palpable thickening persists after antibiotic therapy,
particularly if there is still questionable distortion on the
follow-up ultrasound, then ultrasound-guided core needle biopsy
would be recommended.

I have discussed the findings and recommendations with the patient.
If applicable, a reminder letter will be sent to the patient
regarding the next appointment.

BI-RADS CATEGORY  3: Probably benign.

## 2022-01-14 ENCOUNTER — Other Ambulatory Visit: Payer: Self-pay | Admitting: Obstetrics and Gynecology

## 2022-01-14 ENCOUNTER — Ambulatory Visit
Admission: RE | Admit: 2022-01-14 | Discharge: 2022-01-14 | Disposition: A | Discharge status: Home / Self Care | Payer: Managed Care, Other (non HMO) | Source: Ambulatory Visit | Attending: Obstetrics and Gynecology | Admitting: Obstetrics and Gynecology

## 2022-01-14 DIAGNOSIS — N6320 Unspecified lump in the left breast, unspecified quadrant: Secondary | ICD-10-CM

## 2022-01-14 IMAGING — US US BREAST*L* LIMITED INC AXILLA
1 series · 10 of 10 positions shown · non-contrast
Comparison: Previous exam(s).
COMPARISON: Previous exam(s).

Addendum:
CLINICAL DATA: 40-year-old female presenting for evaluation of pain
and erythema in the lower outer left breast in the region of a prior
MRI biopsy which was performed [DATE]. She was seen by breast
imaging [DATE] for rapid onset of pain in the lateral left
breast. At that time, a small amount of erythema was noted along the
lateral aspect of the left areola. The patient was placed on
doxycycline which improved her symptoms over the next 2-3 days, but
then the pain than increased, worse than its initial onset, and the
lump in erythema in the lower outer left breast worsened as well.
The patient was put on Augmentin yesterday and has had 2 doses, but
not yet noticed any improvement.

EXAM:
ULTRASOUND OF THE LEFT BREAST

[Series 1: us breast*left* limited inc axilla · 0.07mm/px · 10 of 10 slices shown]
[im 1/10]
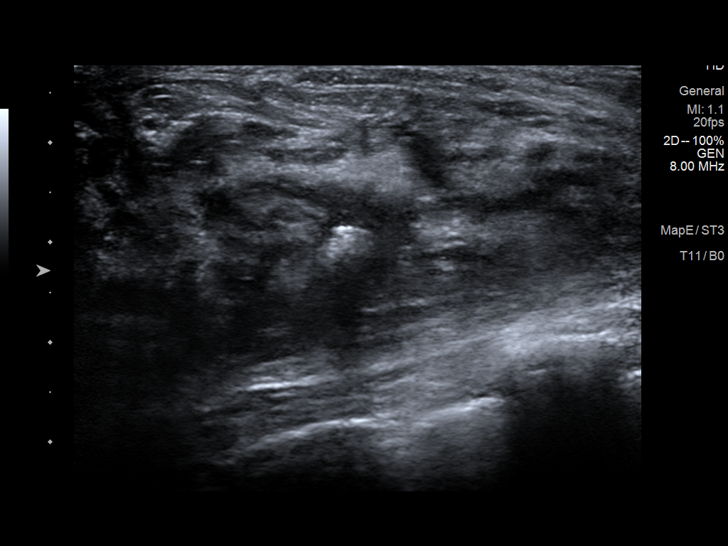
[im 2/10]
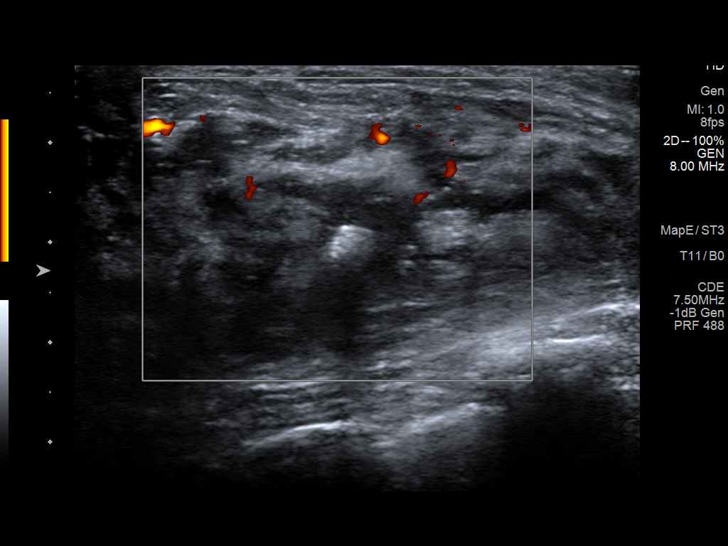
[im 3/10]
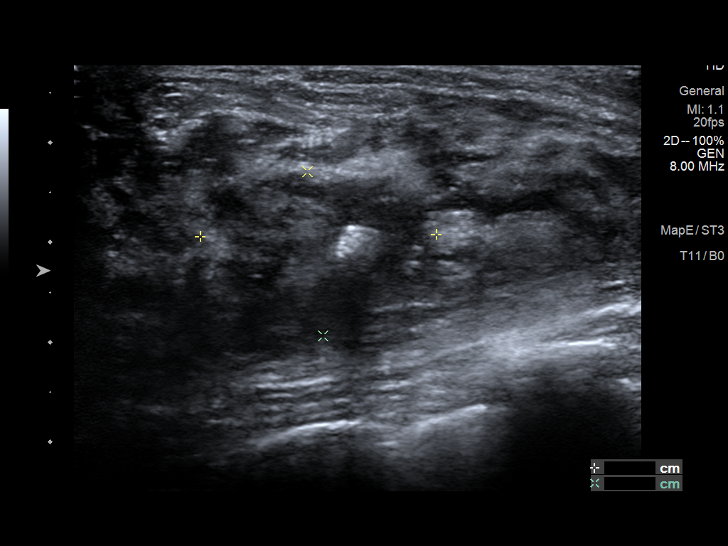
[im 4/10]
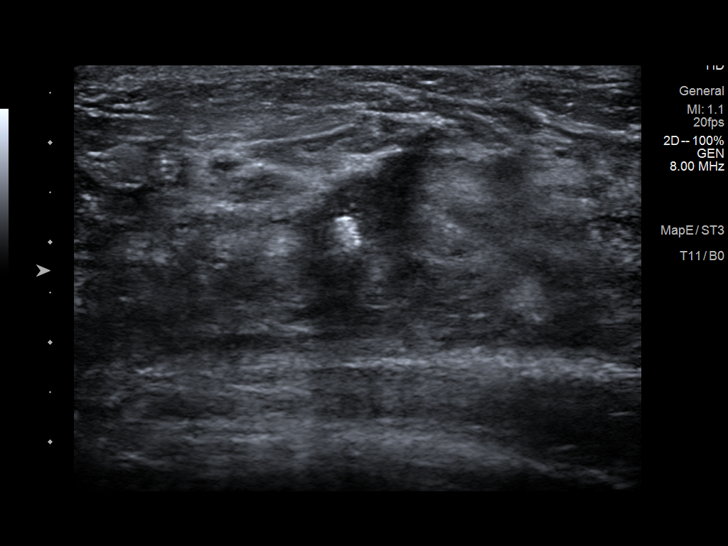
[im 5/10]
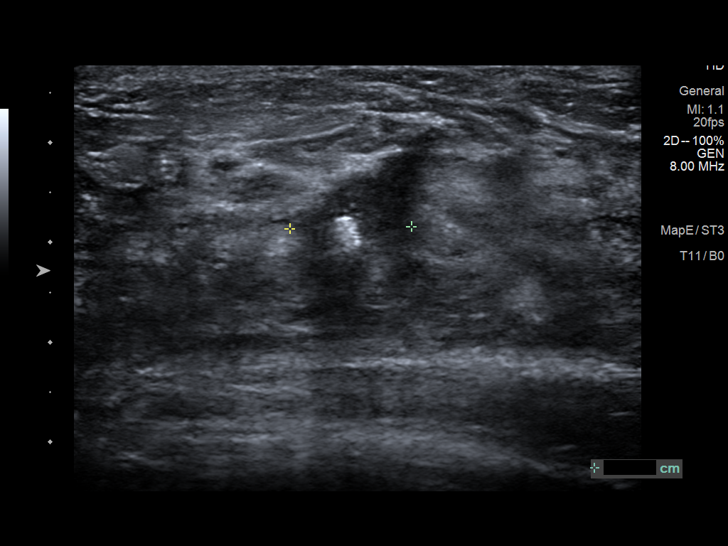
[im 6/10]
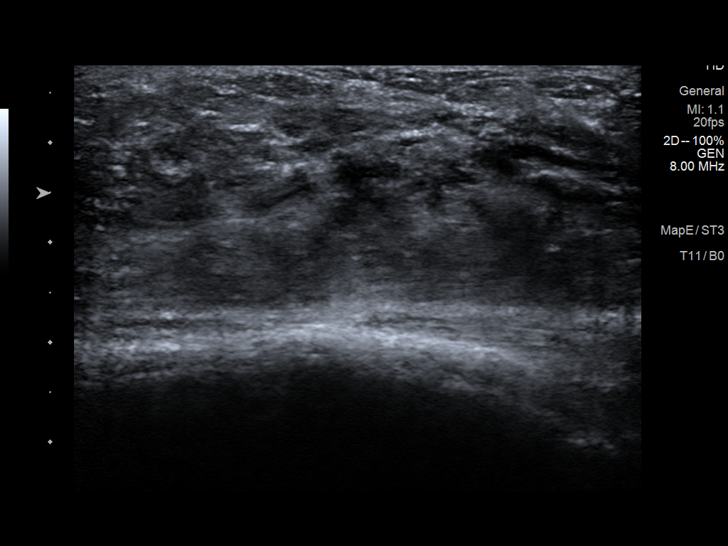
[im 7/10]
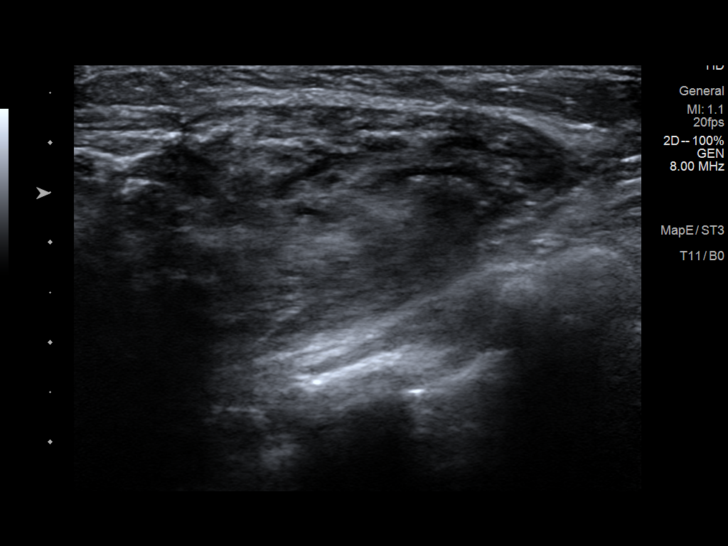
[im 8/10]
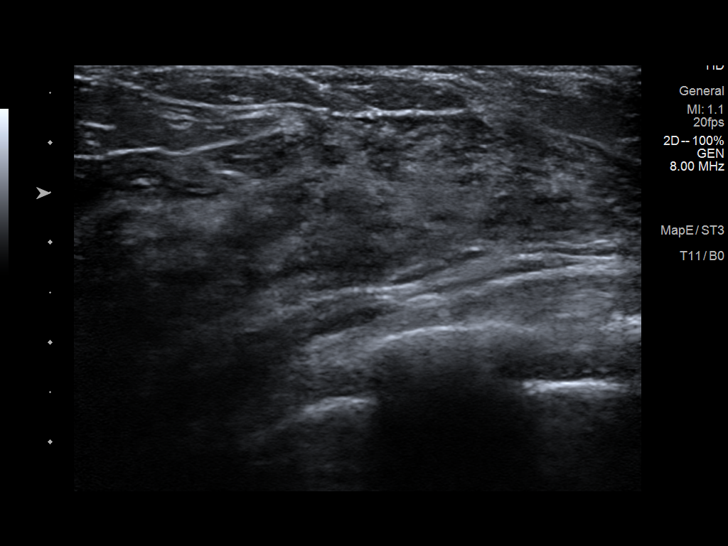
[im 9/10]
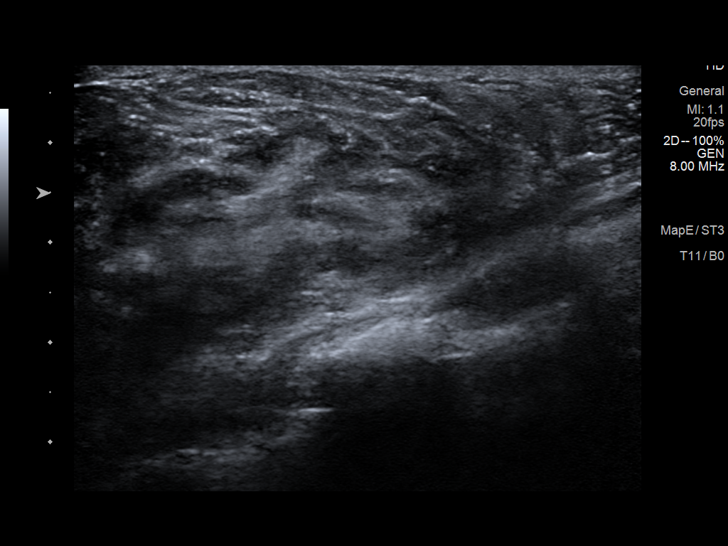
[im 10/10]
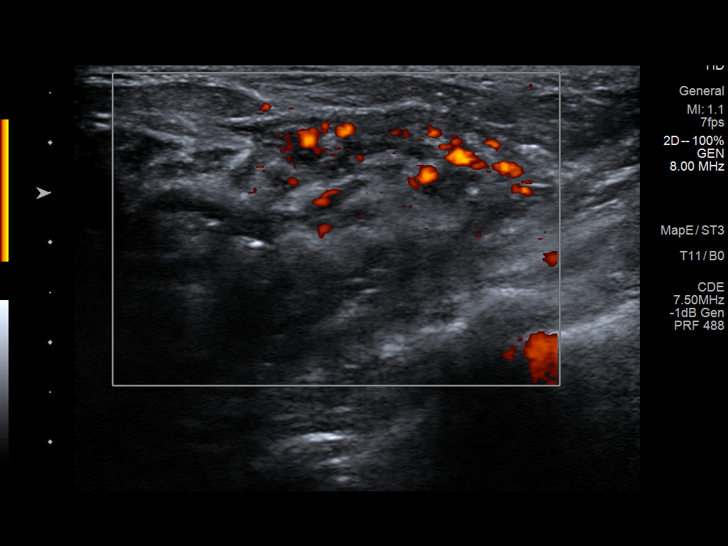

[10 of 10 positions shown; findings below may reference images not displayed]

FINDINGS: On physical exam, there is an easily palpable lump in the lower
outer left breast spanning approximately 5 cm. The bulk of palpable
area is further lateral than the biopsy site. The erythema noted in
the left breast is farther lateral and covers much of the lower
outer quadrant. It is not adjacent to the areola as seen previously.
No evidence of peau d'[REDACTED].

Targeted ultrasound is performed, showing no significant interval
change of the hypoechoic area surrounding the biopsy clip in the
left breast at 4 o'clock, 1 cm from the nipple. This region measures
approximately 2.4 x 1.7 x 1.2 cm. Previously the area measured up to
4.0 cm, however the borders are very difficult to visualize.
Additionally, this hypoechoic area is closer to the nipple than the
palpable area identified on exam. There is no fluid collection at
the palpable site to suggest abscess. Though the tissue is hyperemic
at the palpable site, no masses are identified.
IMPRESSION: There is increased erythema, and an expanding palpable site in the
lower outer left breast without a sonographic correlate. This most
likely represents incompletely treated infection.

RECOMMENDATION:
The patient should continue on her prescribed Augmentin. She is
scheduled to return to breast imaging next [REDACTED]. However, if her
symptoms worsen, she should get in touch with her nurse practitioner
right away, and potentially be seen in breast imaging sooner for
biopsy of the palpable area.

I have discussed the findings and recommendations with the patient.
If applicable, a reminder letter will be sent to the patient
regarding the next appointment.

BI-RADS CATEGORY  3: Probably benign.

ADDENDUM:
The patient return for follow-up evaluation today, [DATE]. She
states that 2-3 days into her course of antibiotics the pain
subsided, and the erythema and palpable lump began to improve. She
is asymptomatic today.

Physical exam of the left breast demonstrates resolution of the
broad area of erythema previously seen. No suspicious palpable lumps
are identified.

Recommendation:

1. The patient will be due for her follow-up breast MRI from her
benign biopsy in [DATE].

2.  Routine annual screening mammography will be due in [DATE].

*** End of Addendum ***
FINDINGS: On physical exam, there is an easily palpable lump in the lower
outer left breast spanning approximately 5 cm. The bulk of palpable
area is further lateral than the biopsy site. The erythema noted in
the left breast is farther lateral and covers much of the lower
outer quadrant. It is not adjacent to the areola as seen previously.
No evidence of peau d'[REDACTED].

Targeted ultrasound is performed, showing no significant interval
change of the hypoechoic area surrounding the biopsy clip in the
left breast at 4 o'clock, 1 cm from the nipple. This region measures
approximately 2.4 x 1.7 x 1.2 cm. Previously the area measured up to
4.0 cm, however the borders are very difficult to visualize.
Additionally, this hypoechoic area is closer to the nipple than the
palpable area identified on exam. There is no fluid collection at
the palpable site to suggest abscess. Though the tissue is hyperemic
at the palpable site, no masses are identified.
IMPRESSION: There is increased erythema, and an expanding palpable site in the
lower outer left breast without a sonographic correlate. This most
likely represents incompletely treated infection.

RECOMMENDATION:
The patient should continue on her prescribed Augmentin. She is
scheduled to return to breast imaging next [REDACTED]. However, if her
symptoms worsen, she should get in touch with her nurse practitioner
right away, and potentially be seen in breast imaging sooner for
biopsy of the palpable area.

I have discussed the findings and recommendations with the patient.
If applicable, a reminder letter will be sent to the patient
regarding the next appointment.

BI-RADS CATEGORY  3: Probably benign.

## 2022-01-15 ENCOUNTER — Other Ambulatory Visit: Payer: Self-pay | Admitting: Obstetrics and Gynecology

## 2022-01-15 DIAGNOSIS — N6320 Unspecified lump in the left breast, unspecified quadrant: Secondary | ICD-10-CM

## 2022-01-18 ENCOUNTER — Ambulatory Visit (INDEPENDENT_AMBULATORY_CARE_PROVIDER_SITE_OTHER): Payer: Managed Care, Other (non HMO) | Admitting: Physician Assistant

## 2022-01-18 ENCOUNTER — Other Ambulatory Visit (HOSPITAL_COMMUNITY): Payer: Self-pay

## 2022-01-18 ENCOUNTER — Encounter: Payer: Self-pay | Admitting: Physician Assistant

## 2022-01-18 VITALS — BP 112/64 | HR 82 | Ht 65.0 in | Wt 132.0 lb

## 2022-01-18 DIAGNOSIS — K648 Other hemorrhoids: Secondary | ICD-10-CM | POA: Diagnosis not present

## 2022-01-18 DIAGNOSIS — K625 Hemorrhage of anus and rectum: Secondary | ICD-10-CM | POA: Diagnosis not present

## 2022-01-18 MED ORDER — HYDROCORTISONE ACETATE 25 MG RE SUPP
25.0000 mg | Freq: Two times a day (BID) | RECTAL | 1 refills | Status: DC
  Filled 2022-01-18: qty 14, 7d supply, fill #0

## 2022-01-18 NOTE — Patient Instructions (Signed)
We have sent the following medications to your pharmacy for you to pick up at your convenience: Hydrocortisone suppository twice daily for one week.   If you are age 40 or older, your body mass index should be between 23-30. Your Body mass index is 21.97 kg/m. If this is out of the aforementioned range listed, please consider follow up with your Primary Care Provider.  If you are age 45 or younger, your body mass index should be between 19-25. Your Body mass index is 21.97 kg/m. If this is out of the aformentioned range listed, please consider follow up with your Primary Care Provider.   ________________________________________________________  The [facility] providers would like to encourage you to use [facility] to communicate with providers for non-urgent requests or questions.  Due to long hold times on the telephone, sending your provider a message by [facility] may be a faster and more efficient way to get a response.  Please allow 48 business hours for a response.  Please remember that this is for non-urgent requests.  _______________________________________________________

## 2022-01-18 NOTE — Progress Notes (Signed)
Chief Complaint: Hemorrhoids and rectal bleeding  HPI:    Suzanne Mclaughlin is a 41 year old female with a past medical history of reflux and melanoma and others listed below, who was referred to me by Ailene Ards, NP for a complaint of hemorrhoids and rectal bleeding    01/01/2022 patient seen by her PCP for new patient visit and at that time described intermittent bleeding in stool and discussed an external hemorrhoid.    01/01/2022 CBC, CMP, hemoglobin A1c and TSH normal.    Today, the patient explains that 80% of the time her bowel movements are very normal and she has a solid stool every day but 20% of the time she will skip a day and when she does have another bowel movement the next day she has a harder than normal stool and she sees some bright red blood on the outside of her stool and it is somewhat painful to pass her stool.  The bleeding will then last for 2 to 3 days but she no longer has any discomfort and then it will stop.  Tells me she has never really used anything on it.  She does take a stool softener occasionally to help maintain soft bowel movements.  Tells me this all started after she had her son a few years ago.    Patient works as an Nurse, children's.    Denies fever, chills, weight loss, family history of colon cancer, abdominal pain or symptoms that awaken her from sleep.     Past Medical History:  Diagnosis Date   Cancer ([place])    melanoma   GERD (gastroesophageal reflux disease)    Postpartum care following vaginal delivery (12/15) 12/03/2016   Vaginal Pap smear, abnormal     Past Surgical History:  Procedure Laterality Date   LEG SURGERY Right 07/2015   1a melanoma removal from lower leg   NASAL SEPTUM SURGERY  2007   TONSILLECTOMY AND ADENOIDECTOMY  1997    No current outpatient medications on file.   No current facility-administered medications for this visit.    Allergies as of 01/18/2022 - Review Complete 01/01/2022  Allergen Reaction Noted   Ivermectin  Other (See Comments) 11/16/2021    Family History  Problem Relation Age of Onset   Hyperlipidemia Mother    Hypertension Mother    Cancer Maternal Aunt     Social History   Socioeconomic History   Marital status: Married    Spouse name: Not on file   Number of children: Not on file   Years of education: Not on file   Highest education level: Not on file  Occupational History   Not on file  Tobacco Use   Smoking status: Never   Smokeless tobacco: Never  Substance and Sexual Activity   Alcohol use: No    Alcohol/week: 0.0 standard drinks   Drug use: No   Sexual activity: Yes  Other Topics Concern   Not on file  Social History Narrative   Not on file   Social Determinants of Health   Financial Resource Strain: Not on file  Food Insecurity: Not on file  Transportation Needs: Not on file  Physical Activity: Not on file  Stress: Not on file  Social Connections: Not on file  Intimate Partner Violence: Not on file    Review of Systems:    Constitutional: No weight loss, fever or chills Skin: No rash  Cardiovascular: No chest pain Respiratory: No SOB  Gastrointestinal: See HPI and otherwise negative Genitourinary:  No dysuria  Neurological: No headache, dizziness or syncope Musculoskeletal: No new muscle or joint pain Hematologic: No bruising Psychiatric: No history of depression or anxiety   Physical Exam:  Vital signs: BP 112/64    Pulse 82    Ht 5\' 5"  (1.651 m)    Wt 132 lb (59.9 kg)    SpO2 98%    BMI 21.97 kg/m    Constitutional:   Pleasant Caucasian female appears to be in NAD, Well developed, Well nourished, alert and cooperative Head:  Normocephalic and atraumatic. Eyes:   PEERL, EOMI. No icterus. Conjunctiva pink. Ears:  Normal auditory acuity. Neck:  Supple Throat: Oral cavity and pharynx without inflammation, swelling or lesion.  Respiratory: Respirations even and unlabored. Lungs clear to auscultation bilaterally.   No wheezes, crackles, or  rhonchi.  Cardiovascular: Normal S1, S2. No MRG. Regular rate and rhythm. No peripheral edema, cyanosis or pallor.  Gastrointestinal:  Soft, nondistended, nontender. No rebound or guarding. Normal bowel sounds. No appreciable masses or hepatomegaly. Rectal: External: 2 small hemorrhoid tags; internal: No mass, no tenderness; anoscopy: Grade 1 hemorrhoids Msk:  Symmetrical without gross deformities. Without edema, no deformity or joint abnormality.  Neurologic:  Alert and  oriented x4;  grossly normal neurologically.  Skin:   Dry and intact without significant lesions or rashes. Psychiatric:  Demonstrates good judgement and reason without abnormal affect or behaviors.  RELEVANT LABS AND IMAGING: CBC    Component Value Date/Time   WBC 6.5 01/01/2022 0924   RBC 4.29 01/01/2022 0924   HGB 12.2 01/01/2022 0924   HCT 36.7 01/01/2022 0924   PLT 209.0 01/01/2022 0924   MCV 85.5 01/01/2022 0924   MCH 29.7 02/25/2019 0436   MCHC 33.4 01/01/2022 0924   RDW 13.3 01/01/2022 0924   LYMPHSABS 2.0 01/01/2022 0924   MONOABS 0.4 01/01/2022 0924   EOSABS 0.2 01/01/2022 0924   BASOSABS 0.1 01/01/2022 0924    CMP     Component Value Date/Time   NA 139 01/01/2022 0924   K 4.1 01/01/2022 0924   CL 105 01/01/2022 0924   CO2 26 01/01/2022 0924   GLUCOSE 80 01/01/2022 0924   BUN 18 01/01/2022 0924   CREATININE 0.68 01/01/2022 0924   CALCIUM 9.0 01/01/2022 0924   PROT 6.7 01/01/2022 0924   ALBUMIN 4.4 01/01/2022 0924   AST 13 01/01/2022 0924   ALT 8 01/01/2022 0924   ALKPHOS 30 (L) 01/01/2022 0924   BILITOT 0.4 01/01/2022 0924    Assessment: 1.  Grade 1 hemorrhoids: Seen at time of exam today 2.  Rectal bleeding: Given description of symptoms most likely this correlates with hemorrhoids above when she is slightly constipated  Plan: 1.  Discussed options with the patient.  We will trial Anusol suppositories twice daily x1 week when she sees more blood.  If this resolves the issue then likely  it is her hemorrhoids.  Asked her to do this on one occasion and repeat if she sees any more bleeding.  If the bleeding changes at all or her symptoms get worse or more frequent then would recommend that we work-up further. 2.  If patient request further follow-up or if symptoms change would recommend a colonoscopy for rectal bleeding.  At that time she can be evaluated for possible hemorrhoid banding to see if she is a candidate. 3.  Encouraged the patient to keep her stools soft and solid. 4.  Patient to follow in clinic with [place] as needed.  She was assigned to  Dr. Candis Schatz today.  Ellouise Newer, PA-C [facility] 01/18/2022, 2:56 PM  Cc: Ailene Ards, NP

## 2022-01-18 NOTE — Progress Notes (Signed)
Agree with the assessment and plan as outlined by Jennifer Lemmon, PA-C. ? ?Andrea Ferrer E. Lawrence Mitch, MD ? ?

## 2022-01-22 ENCOUNTER — Other Ambulatory Visit: Payer: Self-pay

## 2022-01-22 ENCOUNTER — Ambulatory Visit
Admission: RE | Admit: 2022-01-22 | Discharge: 2022-01-22 | Disposition: A | Discharge status: Home / Self Care | Payer: Managed Care, Other (non HMO) | Source: Ambulatory Visit | Attending: Obstetrics and Gynecology | Admitting: Obstetrics and Gynecology

## 2022-01-22 DIAGNOSIS — N6320 Unspecified lump in the left breast, unspecified quadrant: Secondary | ICD-10-CM

## 2022-03-15 ENCOUNTER — Other Ambulatory Visit: Payer: Self-pay | Admitting: Obstetrics and Gynecology

## 2022-03-15 DIAGNOSIS — R928 Other abnormal and inconclusive findings on diagnostic imaging of breast: Secondary | ICD-10-CM

## 2022-03-15 DIAGNOSIS — Z1231 Encounter for screening mammogram for malignant neoplasm of breast: Secondary | ICD-10-CM

## 2022-04-02 ENCOUNTER — Encounter: Payer: Self-pay | Admitting: Nurse Practitioner

## 2022-04-02 ENCOUNTER — Ambulatory Visit (INDEPENDENT_AMBULATORY_CARE_PROVIDER_SITE_OTHER): Payer: Managed Care, Other (non HMO) | Admitting: Nurse Practitioner

## 2022-04-02 VITALS — BP 126/70 | HR 78 | Temp 98.1°F | Ht 65.0 in | Wt 131.0 lb

## 2022-04-02 DIAGNOSIS — Z0001 Encounter for general adult medical examination with abnormal findings: Secondary | ICD-10-CM

## 2022-04-02 DIAGNOSIS — K644 Residual hemorrhoidal skin tags: Secondary | ICD-10-CM

## 2022-04-02 NOTE — Progress Notes (Signed)
? ? ? ?Subjective:  ?Patient ID: Suzanne Mclaughlin, female    DOB: 07/17/1981  Age: 41 y.o. MRN: [id number] ? ?CC:  ?Chief Complaint  ?Patient presents with  ? Follow-up  ?  Patient has no issues or concerns  ?  ? ? ?HPI  ?This patient arrives today for the above. ? ?She is here for routine follow-up and discuss blood work that was collected at last office visit.  Blood work showed no significant abnormalities on metabolic panel, cholesterol within normal limits, no anemia, A1c of 5.2, and TSH of 3.30. ? ?She was experiencing intermittent rectal bleeding and was referred to GI.  They recommend she trial Anusol as it is believed that bleeding is related to intermittent constipation and hemorrhoids.  Since seeing GI she has used Anusol 1 time and this did result in resolution of bleeding.  She was also told to follow back up with GI if bleeding worsens, and she is aware and willing to do this if this occurs. ? ?She also experienced a breast mass to the left breast since last office visit.  She did follow-up with OB and underwent a breast MRI as well as biopsy.  No malignancy was found.  She was told to follow-up and have repeat breast MRI next month, which she is considering not doing.  She was told follow-up MRI is to ensure that the clip placed during biopsy was placed in the correct position.  Thus, she feels she may not do the MRI if insurance will not pay for it.  She does have her screening mammogram scheduled for next month as well.  She does plan on following up with OB in June for Pap smear. ? ?Health maintenance: As stated above Pap smear scheduled to be done in June.  Screening mammogram scheduled to be done next month.  She is up-to-date with tetanus and flu vaccines. ? ?Past Medical History:  ?Diagnosis Date  ? Cancer [facility])   ? melanoma  ? GERD (gastroesophageal reflux disease)   ? Postpartum care following vaginal delivery (12/15) 12/03/2016  ? Vaginal Pap smear, abnormal   ? ? ? ? ?Family History  ?Problem  Relation Age of Onset  ? Hyperlipidemia Mother   ? Hypertension Mother   ? Cancer Maternal Aunt   ? Colon cancer Other   ?     p. uncle  ? Stomach cancer Neg Hx   ? ? ?Social History  ? ?Social History Narrative  ? Not on file  ? ?Social History  ? ?Tobacco Use  ? Smoking status: Never  ? Smokeless tobacco: Never  ?Substance Use Topics  ? Alcohol use: No  ?  Alcohol/week: 0.0 standard drinks  ? ? ? ?No outpatient medications have been marked as taking for the 04/02/22 encounter (Office Visit) with Ailene Ards, NP.  ? ? ?ROS:  ?No reported symptoms. ? ? ?Objective:  ? ?Today's Vitals: BP 126/70 (BP Location: Right Arm, Patient Position: Sitting, Cuff Size: Normal)   Pulse 78   Temp 98.1 ?F (36.7 ?C) (Oral)   Ht '5\' 5"'$  (1.651 m)   Wt 131 lb (59.4 kg)   SpO2 99%   BMI 21.80 kg/m?  ? ?  04/02/2022  ?  9:00 AM 01/18/2022  ?  2:56 PM 01/01/2022  ?  8:57 AM  ?Vitals with BMI  ?Height '5\' 5"'$  '5\' 5"'$  '5\' 5"'$   ?Weight 131 lbs 132 lbs 134 lbs  ?BMI 21.8 21.97 22.3  ?Systolic 956 387 564  ?Diastolic  70 64 78  ?Pulse 78 82 73  ?  ? ?Physical Exam ?Vitals reviewed.  ?Constitutional:   ?   Appearance: Normal appearance.  ?HENT:  ?   Head: Normocephalic and atraumatic.  ?   Right Ear: Tympanic membrane, ear canal and external ear normal.  ?   Left Ear: Tympanic membrane, ear canal and external ear normal.  ?Eyes:  ?   General:     ?   Right eye: No discharge.     ?   Left eye: No discharge.  ?   Extraocular Movements: Extraocular movements intact.  ?   Conjunctiva/sclera: Conjunctivae normal.  ?   Pupils: Pupils are equal, round, and reactive to light.  ?Neck:  ?   Vascular: No carotid bruit.  ?Cardiovascular:  ?   Rate and Rhythm: Normal rate and regular rhythm.  ?   Pulses: Normal pulses.  ?   Heart sounds: Normal heart sounds. No murmur heard. ?Pulmonary:  ?   Effort: Pulmonary effort is normal.  ?   Breath sounds: Normal breath sounds.  ?Chest:  ?   Comments: Deferred per patient preference ?Abdominal:  ?   General: Abdomen is  flat. Bowel sounds are normal. There is no distension.  ?   Palpations: Abdomen is soft. There is no mass.  ?   Tenderness: There is no abdominal tenderness.  ?Musculoskeletal:     ?   General: No tenderness.  ?   Cervical back: Neck supple. No muscular tenderness.  ?   Right lower leg: No edema.  ?   Left lower leg: No edema.  ?Lymphadenopathy:  ?   Cervical: No cervical adenopathy.  ?   Upper Body:  ?   Right upper body: No supraclavicular adenopathy.  ?   Left upper body: No supraclavicular adenopathy.  ?Skin: ?   General: Skin is warm and dry.  ?Neurological:  ?   General: No focal deficit present.  ?   Mental Status: She is alert and oriented to person, place, and time.  ?   Motor: No weakness.  ?   Gait: Gait normal.  ?Psychiatric:     ?   Mood and Affect: Mood normal.     ?   Behavior: Behavior normal.     ?   Judgment: Judgment normal.  ? ? ? ? ? ? ? ?Assessment and Plan  ? ?No diagnosis found. ? ? ?Plan: ?See plan via problem list below. ? ? ?Tests ordered ?No orders of the defined types were placed in this encounter. ? ? ? ? ?No orders of the defined types were placed in this encounter. ? ? ?Patient to follow-up in 1 year for annual physical exam, or sooner as needed. ? ?Ailene Ards, NP ? ?

## 2022-04-02 NOTE — Assessment & Plan Note (Addendum)
Chronic, intermittent, improved.  She will continue using Anusol suppositories as needed.  She was again encouraged to follow-up with GI if symptoms persist or worsen.  She reports her understanding. ?

## 2022-04-02 NOTE — Assessment & Plan Note (Addendum)
Overall exam benign.  Patient to follow-up in 1 year for annual exam. ?

## 2022-04-26 ENCOUNTER — Ambulatory Visit
Admission: RE | Admit: 2022-04-26 | Discharge: 2022-04-26 | Disposition: A | Discharge status: Home / Self Care | Payer: Managed Care, Other (non HMO) | Source: Ambulatory Visit | Attending: Obstetrics and Gynecology | Admitting: Obstetrics and Gynecology

## 2022-04-26 DIAGNOSIS — R928 Other abnormal and inconclusive findings on diagnostic imaging of breast: Secondary | ICD-10-CM

## 2022-04-26 IMAGING — MR MR BREAST BILAT WO/W CM
8 of 12 series · 32 of 48 positions shown · IV contrast (gadavist)
Comparison: Prior exams.  Previous breast MRI from [DATE].

CLINICAL DATA: Short-term follow-up from a benign left breast
biopsy, performed under MRI guidance on [DATE]. Patient has had
a previous benign biopsy of the 12 o'clock position of the right
breast, which revealed Pash. On the diagnostic MRI from [DATE],
an additional area of abnormal enhancement was noted in the lower
outer right breast, which was not visualized the biopsy exam from
[DATE]. The initial diagnostic imaging was performed for diffuse
right breast pain and left nipple discharge.

EXAM:
BILATERAL BREAST MRI WITH AND WITHOUT CONTRAST
TECHNIQUE: Multiplanar, multisequence MR images of both breasts were obtained
prior to and following the intravenous administration of 6 ml of
Gadavist

[Series 2: t2_tirm_tra ipat (a-p) · axial · 3.0mm · 0.66mm/px · 1 of 55 slices shown]
[im 1/55]
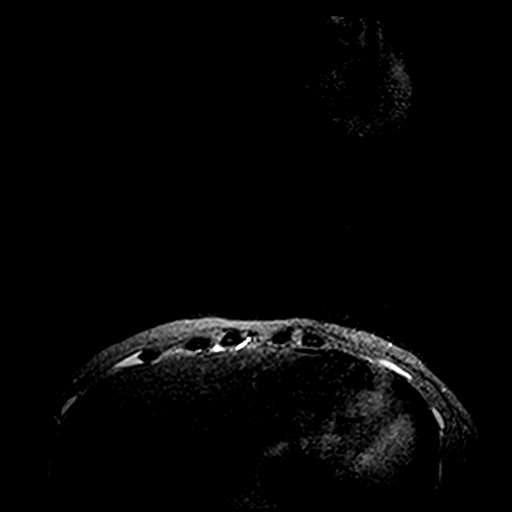

[Series 3: fl3d pre-cm no · axial · non-contrast · 1.2mm · 0.89mm/px · z∈[-79,+92]mm · 5 of 144 slices shown]
[im 1/144]
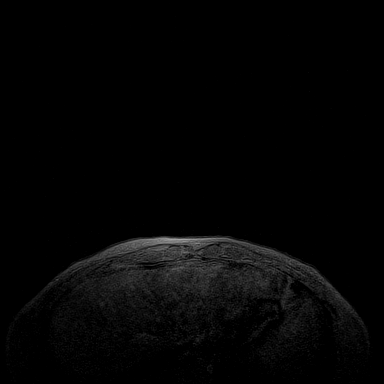
[im 36/144]
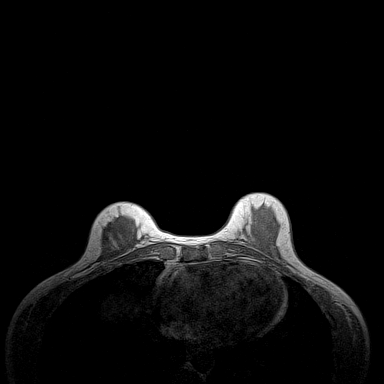
[im 72/144]
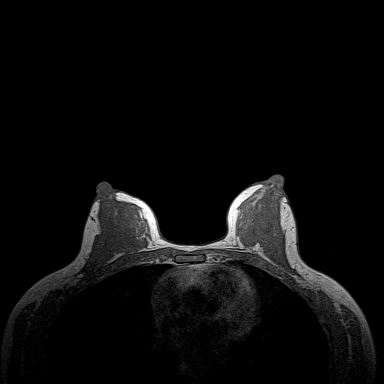
[im 108/144]
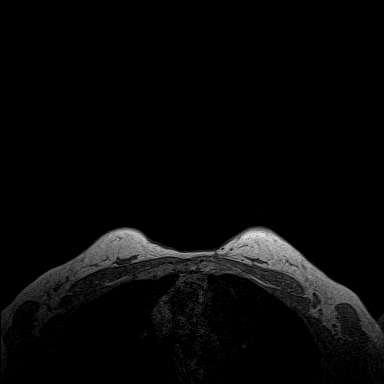
[im 144/144]
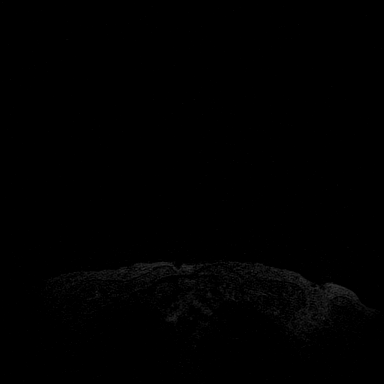

[Series 4: fl3d pre-cm · axial · non-contrast · 1.2mm · 0.89mm/px · z∈[-79,+92]mm · 5 of 144 slices shown]
[im 1/144]
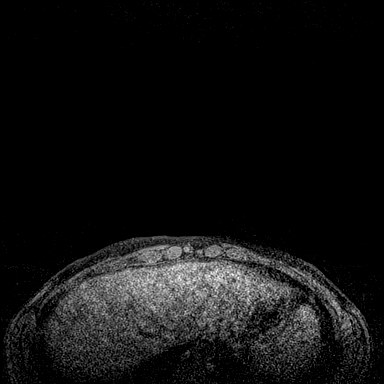
[im 36/144]
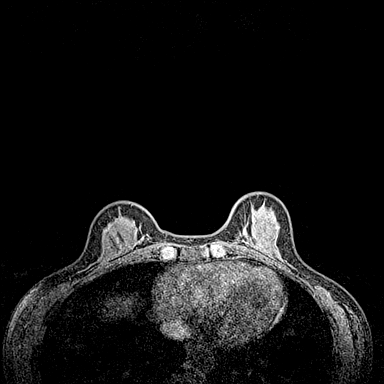
[im 72/144]
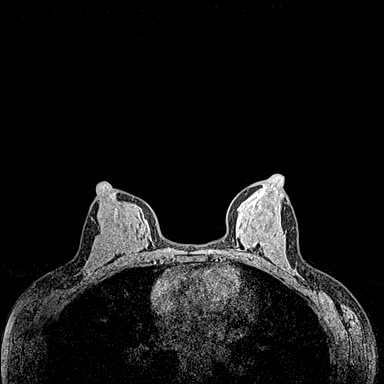
[im 108/144]
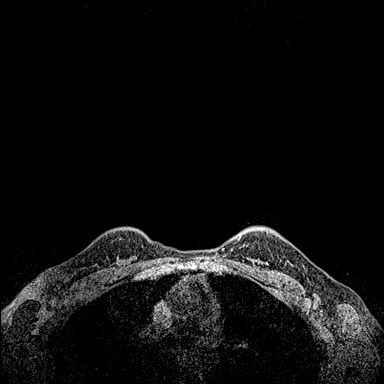
[im 144/144]
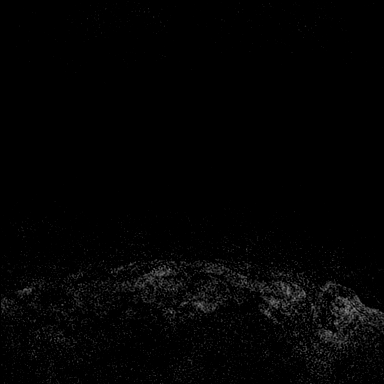

[Series 5: fl3d post-cm 20 · axial · 1.2mm · 0.89mm/px · z∈[-79,+92]mm · 5 of 144 slices shown (1 of 3)]
[im 1/144]
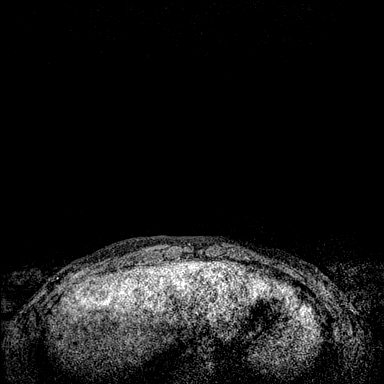
[im 36/144]
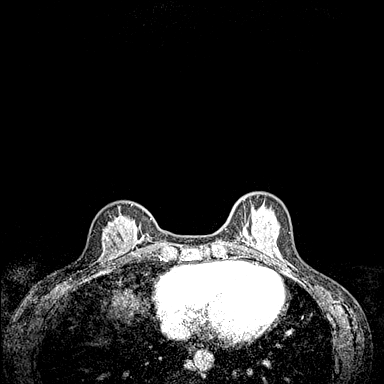
[im 72/144]
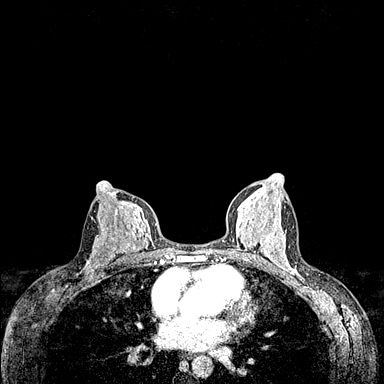
[im 108/144]
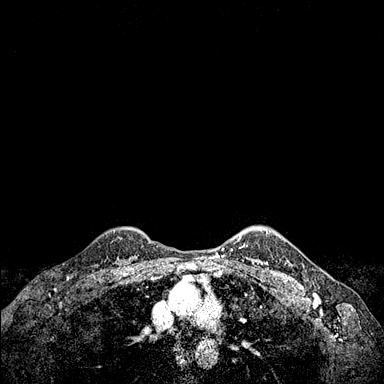
[im 144/144]
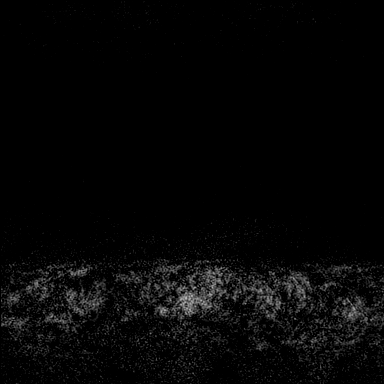

[Series 6: fl3d post-cm 20 · axial · 1.2mm · 0.89mm/px · z∈[-79,+92]mm · 5 of 144 slices shown (2 of 3)]
[im 1/144]
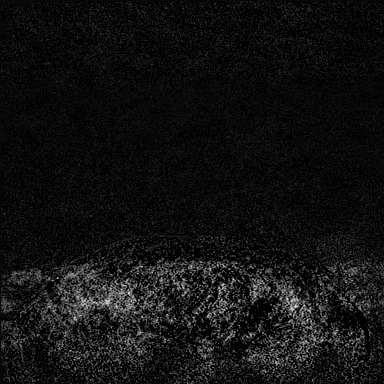
[im 36/144]
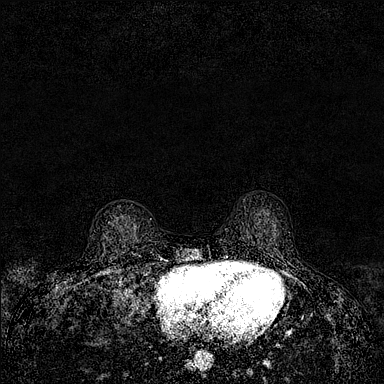
[im 72/144]
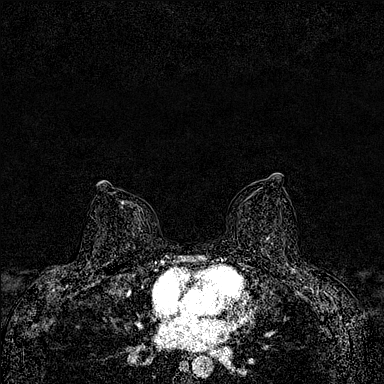
[im 108/144]
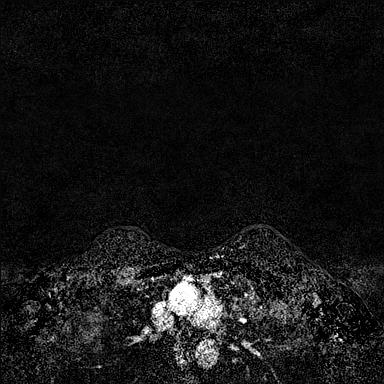
[im 144/144]
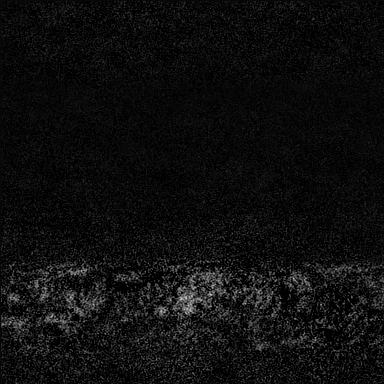

[Series 7: fl3d post-cm 20 · axial · 172.8mm · 0.89mm/px · 1 of 1 slices shown (3 of 3)]
[im 1/1]
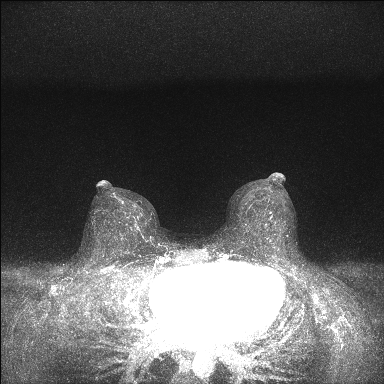

[Series 8: fl3d post-cm 3 · axial · 1.2mm · 0.89mm/px · z∈[-79,+92]mm · 6 of 144 slices shown (1 of 2)]
[im 1/144]
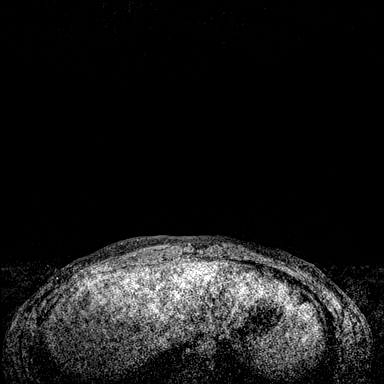
[im 29/144]
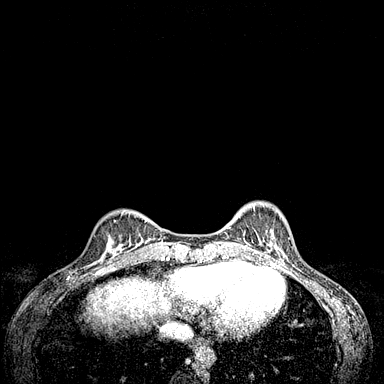
[im 58/144]
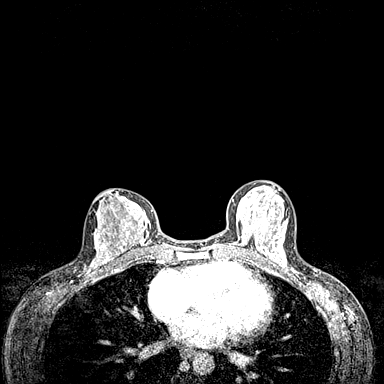
[im 86/144]
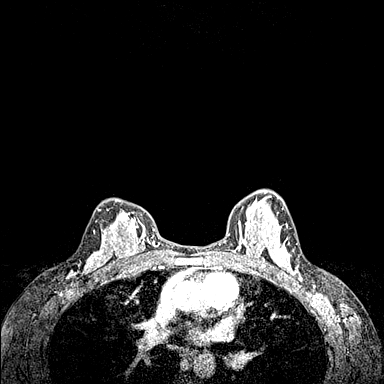
[im 115/144]
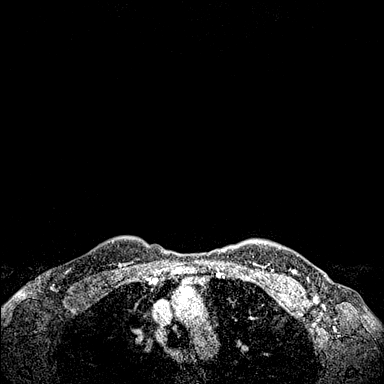
[im 144/144]
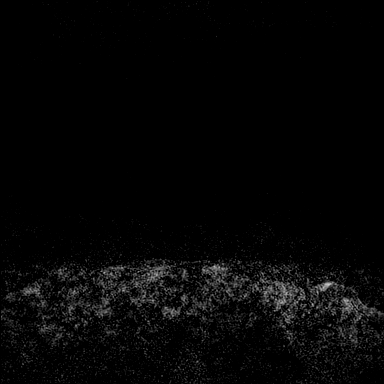

[Series 9: fl3d post-cm 3 · axial · 1.2mm · 0.89mm/px · z∈[-79,+23]mm · 4 of 144 slices shown (2 of 2)]
[im 1/144]
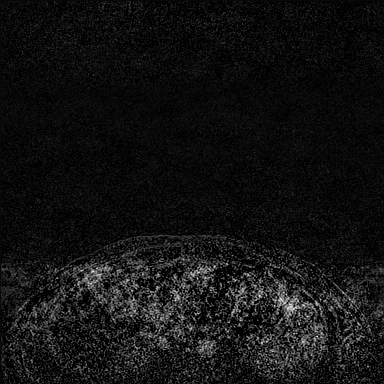
[im 29/144]
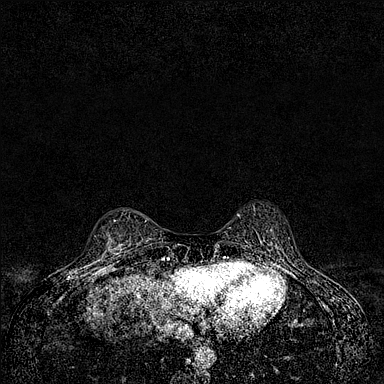
[im 58/144]
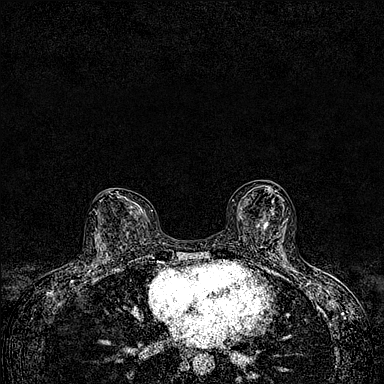
[im 86/144]
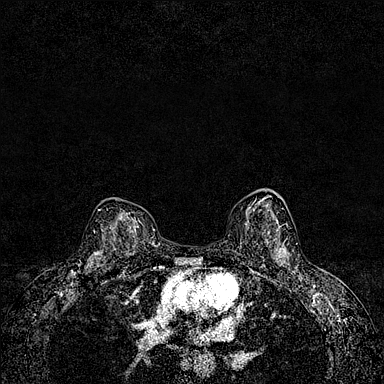

[32 of 48 positions shown; findings below may reference images not displayed]

Three-dimensional MR images were rendered by post-processing of the
original MR data on an independent workstation. The
three-dimensional MR images were interpreted, and findings are
reported in the following complete MRI report for this study. Three
dimensional images were evaluated at the independent interpreting
workstation using the DynaCAD thin client.
FINDINGS: Breast composition: d. Extreme fibroglandular tissue.

Background parenchymal enhancement: Mild susceptibility artifact

Right breast: No mass or abnormal enhancement. Noted in the upper
anterior right breast from the prior benign biopsy. The adjacent
enhancement noted on the previous MRI is no longer visualized. The
enhancement that was noted in the lower outer right breast the prior
diagnostic breast MRI is also no longer visualized. Susceptibility
artifact

Left breast: No mass or abnormal enhancement. Biopsy clip
susceptibility artifact noted in lower outer left breast from the
benign MRI guided biopsy performed on [DATE]. There is no
current abnormal enhancement adjacent to the biopsy marker clip.

Lymph nodes: No abnormal appearing lymph nodes.

Ancillary findings:  None.
IMPRESSION: 1. No MRI evidence of breast malignancy.

RECOMMENDATION:
Annual screening mammography.

BI-RADS CATEGORY  1: Negative.

## 2022-04-26 MED ORDER — GADOBUTROL 1 MMOL/ML IV SOLN
6.0000 mL | Freq: Once | INTRAVENOUS | Status: AC | PRN
  Administered 2022-04-26: 6 mL via INTRAVENOUS

## 2022-05-03 ENCOUNTER — Ambulatory Visit
Admission: RE | Admit: 2022-05-03 | Discharge: 2022-05-03 | Disposition: A | Discharge status: Home / Self Care | Payer: Managed Care, Other (non HMO) | Source: Ambulatory Visit | Attending: Obstetrics and Gynecology | Admitting: Obstetrics and Gynecology

## 2022-05-03 DIAGNOSIS — Z1231 Encounter for screening mammogram for malignant neoplasm of breast: Secondary | ICD-10-CM

## 2022-05-03 IMAGING — MG MM DIGITAL SCREENING BILAT W/ TOMO AND CAD
8 series · 9 of 24 positions shown · non-contrast
Comparison: Previous exam(s).

CLINICAL DATA: Screening.

EXAM:
DIGITAL SCREENING BILATERAL MAMMOGRAM WITH TOMOSYNTHESIS AND CAD
TECHNIQUE: Bilateral screening digital craniocaudal and mediolateral oblique
mammograms were obtained. Bilateral screening digital breast
tomosynthesis was performed. The images were evaluated with
computer-aided detection.

[L MLO synth-2D]
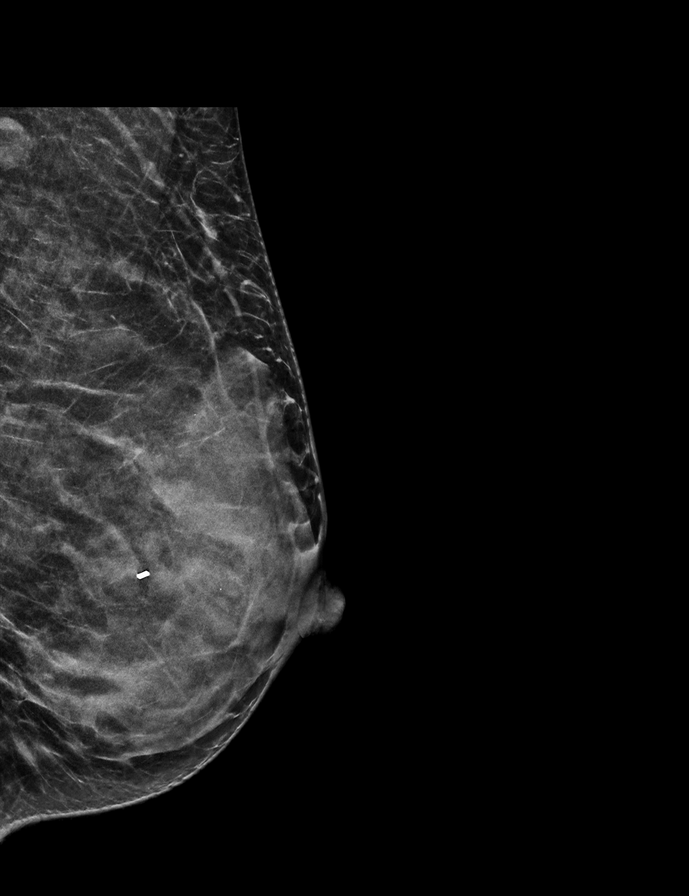

[L CC synth-2D]
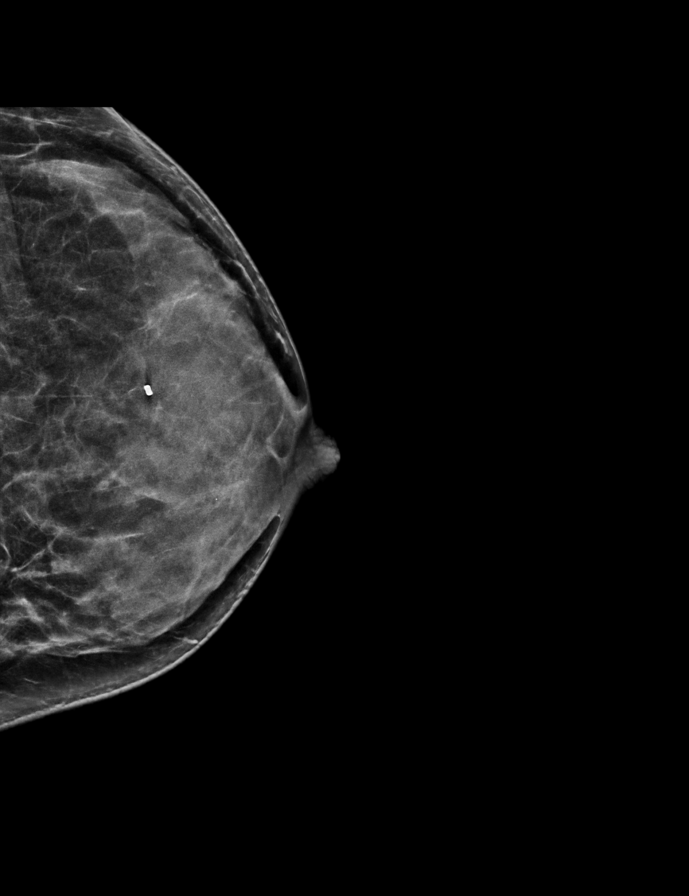

[R MLO synth-2D]
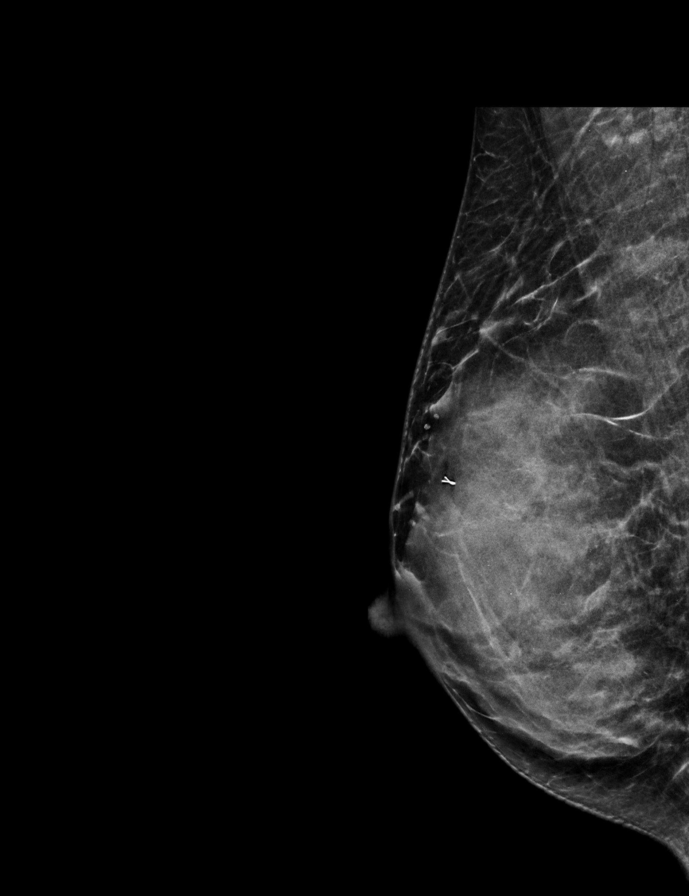

[R CC synth-2D]
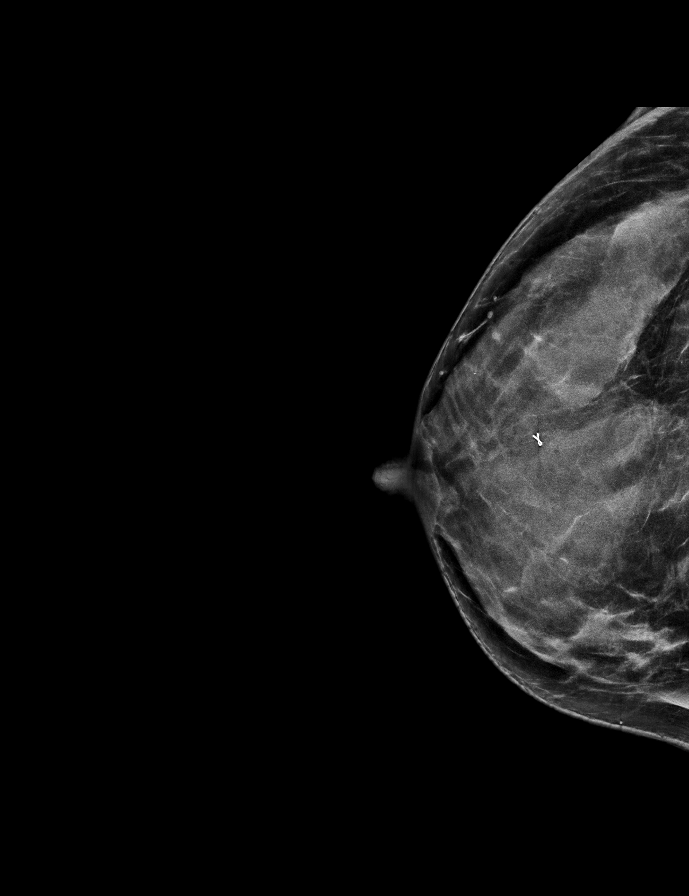

[R CC tomo · 2 of 53 frames shown]
[frame 18/53]
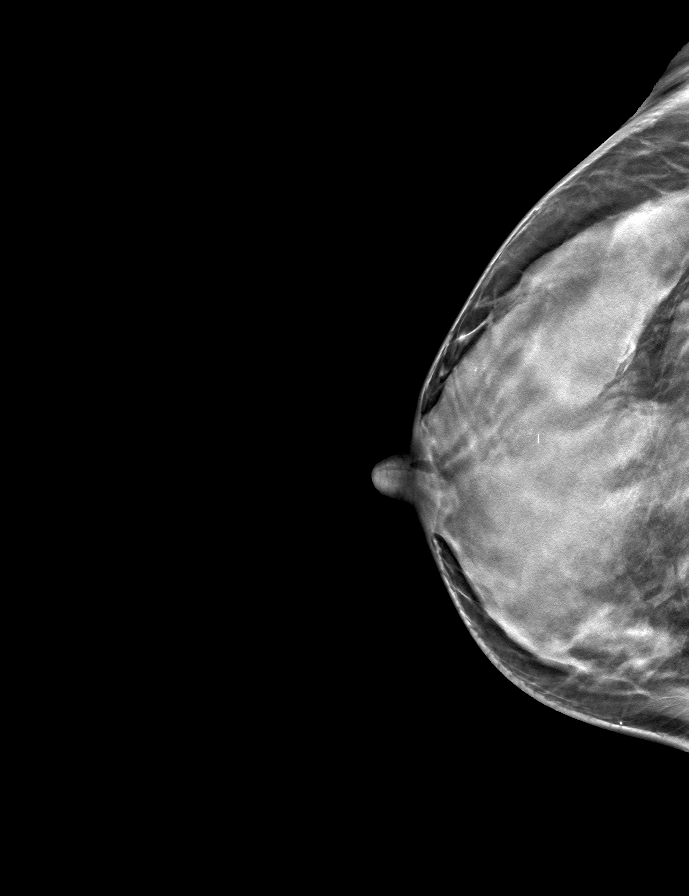
[frame 27/53]
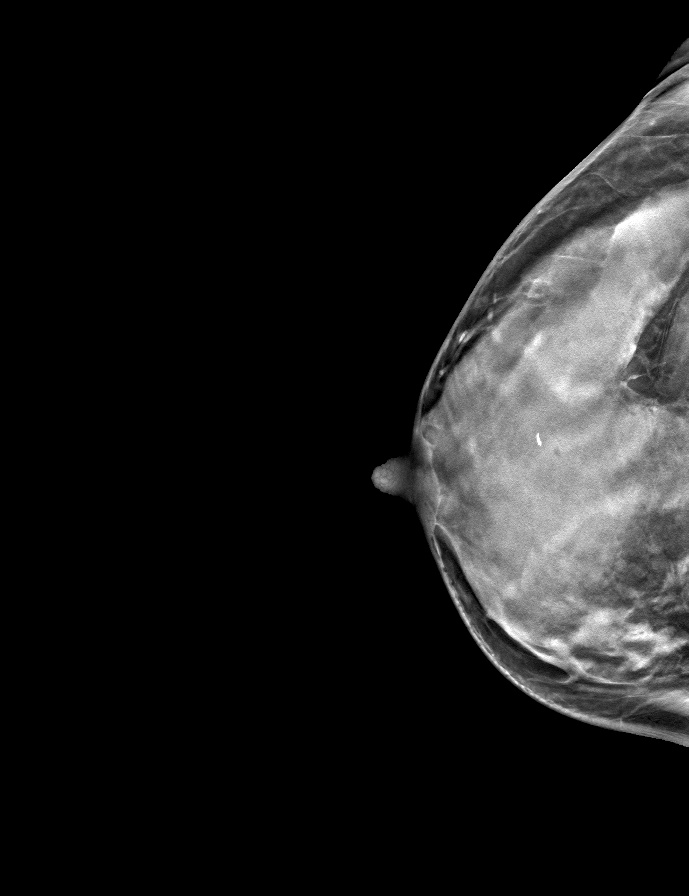

[L CC tomo · tomo slice 27/52.0]
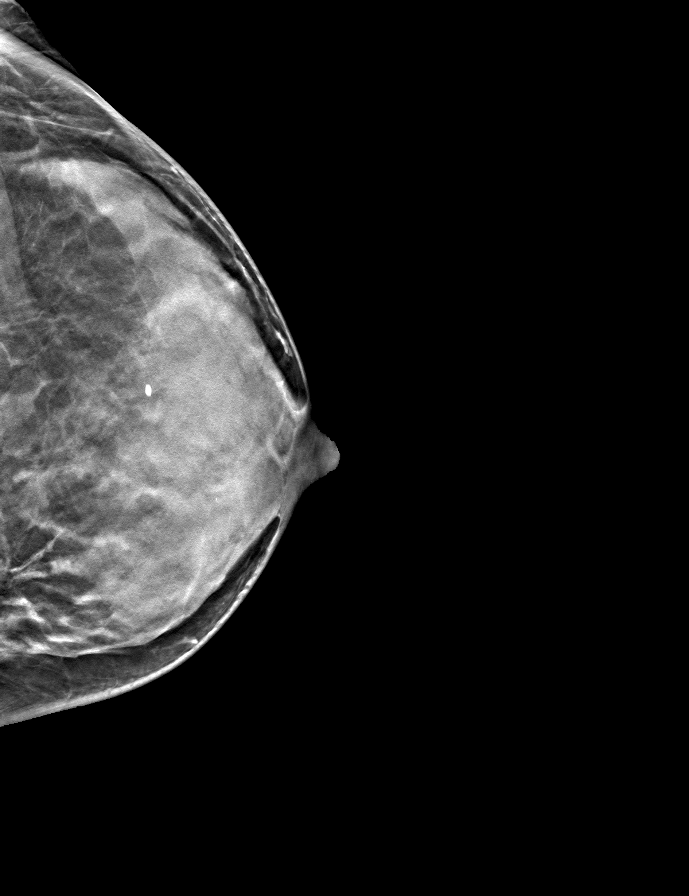

[R MLO tomo · tomo slice 25/49.0]
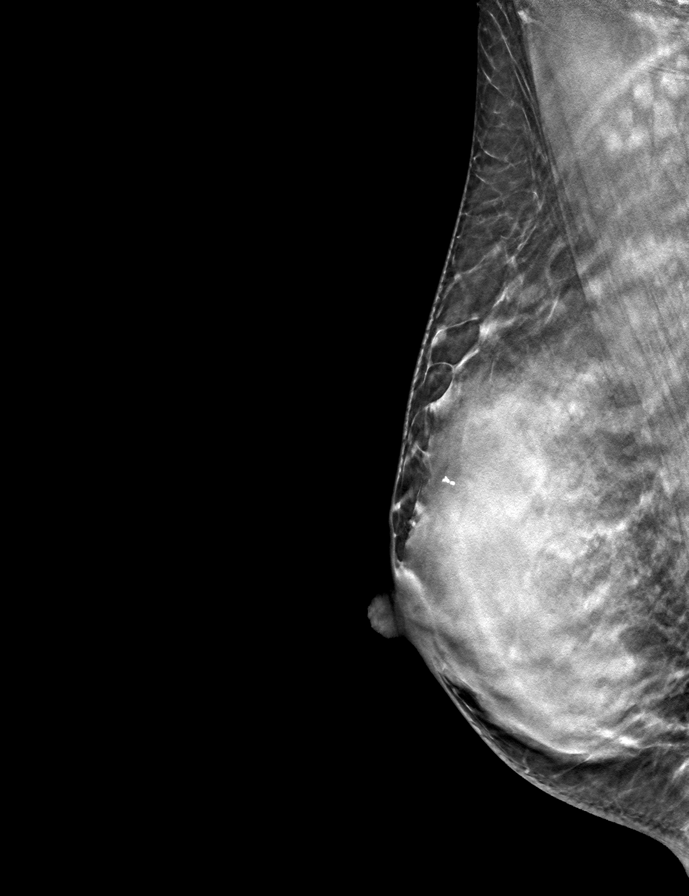

[L MLO tomo · tomo slice 25/49.0]
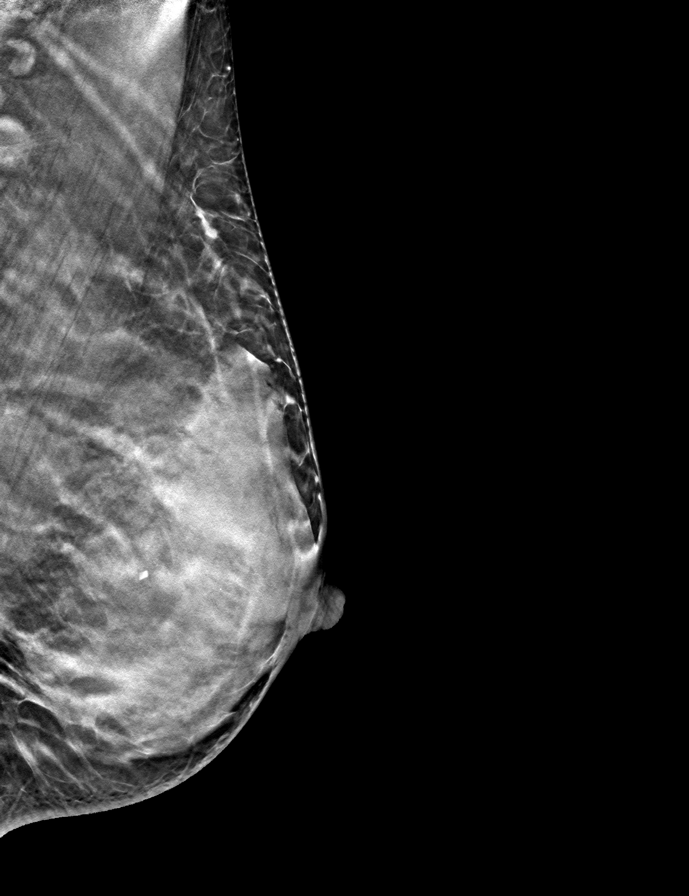

[9 of 24 positions shown; findings below may reference images not displayed]

ACR Breast Density Category d: The breast tissue is extremely dense,
which lowers the sensitivity of mammography
FINDINGS: There are no findings suspicious for malignancy.
IMPRESSION: No mammographic evidence of malignancy. A result letter of this
screening mammogram will be mailed directly to the patient.

RECOMMENDATION:
Screening mammogram in one year. (Code:[7P])

BI-RADS CATEGORY  1: Negative.

## 2022-05-18 ENCOUNTER — Other Ambulatory Visit (HOSPITAL_COMMUNITY): Payer: Self-pay

## 2022-05-19 ENCOUNTER — Other Ambulatory Visit (HOSPITAL_COMMUNITY): Payer: Self-pay

## 2022-05-19 MED ORDER — TACROLIMUS 0.1 % EX OINT
TOPICAL_OINTMENT | CUTANEOUS | 0 refills | Status: DC
  Filled 2022-05-19: qty 100, 30d supply, fill #0

## 2022-05-20 ENCOUNTER — Other Ambulatory Visit (HOSPITAL_COMMUNITY): Payer: Self-pay

## 2022-06-15 ENCOUNTER — Other Ambulatory Visit: Payer: Self-pay | Admitting: Obstetrics and Gynecology

## 2022-06-15 DIAGNOSIS — Z803 Family history of malignant neoplasm of breast: Secondary | ICD-10-CM

## 2022-08-04 ENCOUNTER — Ambulatory Visit: Payer: Managed Care, Other (non HMO) | Admitting: Emergency Medicine

## 2022-08-11 ENCOUNTER — Telehealth: Payer: Managed Care, Other (non HMO) | Admitting: Physician Assistant

## 2022-08-11 ENCOUNTER — Other Ambulatory Visit (HOSPITAL_COMMUNITY): Payer: Self-pay

## 2022-08-11 DIAGNOSIS — H60392 Other infective otitis externa, left ear: Secondary | ICD-10-CM | POA: Diagnosis not present

## 2022-08-11 MED ORDER — CIPROFLOXACIN-DEXAMETHASONE 0.3-0.1 % OT SUSP
4.0000 [drp] | Freq: Two times a day (BID) | OTIC | 0 refills | Status: DC
  Filled 2022-08-11: qty 7.5, 7d supply, fill #0

## 2022-08-11 NOTE — Progress Notes (Signed)
Virtual Visit Consent   Suzanne Mclaughlin, you are scheduled for a virtual visit with a [facility] provider today. Just as with appointments in the office, your consent must be obtained to participate. Your consent will be active for this visit and any virtual visit you may have with one of our providers in the next 365 days. If you have a MyChart account, a copy of this consent can be sent to you electronically.  As this is a virtual visit, video technology does not allow for your provider to perform a traditional examination. This may limit your provider's ability to fully assess your condition. If your provider identifies any concerns that need to be evaluated in person or the need to arrange testing (such as labs, EKG, etc.), we will make arrangements to do so. Although advances in technology are sophisticated, we cannot ensure that it will always work on either your end or our end. If the connection with a video visit is poor, the visit may have to be switched to a telephone visit. With either a video or telephone visit, we are not always able to ensure that we have a secure connection.  By engaging in this virtual visit, you consent to the provision of healthcare and authorize for your insurance to be billed (if applicable) for the services provided during this visit. Depending on your insurance coverage, you may receive a charge related to this service.  I need to obtain your verbal consent now. Are you willing to proceed with your visit today? Suzanne Mclaughlin has provided verbal consent on 08/11/2022 for a virtual visit (video or telephone). Mar Daring, PA-C  Date: 08/11/2022 3:15 PM  Virtual Visit via Video Note   IMar Daring, connected with  Suzanne Mclaughlin  ([id number], [birth year]) on 08/11/22 at  3:15 PM EDT by a video-enabled telemedicine application and verified that I am speaking with the correct person using two identifiers.  Location: Patient: Virtual Visit Location  Patient: Other: work;isolated Provider: Virtual Visit Location Provider: Home Office   I discussed the limitations of evaluation and management by telemedicine and the availability of in person appointments. The patient expressed understanding and agreed to proceed.    History of Present Illness: Suzanne Mclaughlin is a 41 y.o. who identifies as a female who was assigned female at birth, and is being seen today for ear pain.  HPI: Otalgia  There is pain in the left ear. This is a new problem. The current episode started in the past 7 days. The problem occurs constantly. The problem has been gradually worsening. There has been no fever. Associated symptoms include hearing loss (muffled). Pertinent negatives include no coughing, ear discharge, headaches, rhinorrhea or sore throat. She has tried nothing for the symptoms. The treatment provided no relief. Her past medical history is significant for a chronic ear infection.   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 Tympanic membrane was clear and moveable with air.   Problems:  Patient Active Problem List   Diagnosis Date Noted   External hemorrhoid, bleeding 04/02/2022   Encounter for general adult medical examination with abnormal findings 04/02/2022   Benign neoplasm of skin of lower limb 01/01/2022   History of neoplasm 01/01/2022   Lentigo 01/01/2022   Melanocytic nevi of trunk 01/01/2022   Pain in joint of right hip 12/08/2020   Personal history of malignant melanoma of skin 12/02/2015  Allergies:  Allergies  Allergen Reactions   Ivermectin Other (See Comments)   Medications:  Current Outpatient Medications:    ciprofloxacin-dexamethasone (CIPRODEX) OTIC suspension, Place 4 drops into the left ear 2 (two) times daily. X 7 days, Disp: 7.5 mL, Rfl: 0   hydrocortisone (ANUSOL-HC) 25 MG suppository, Unwrap and insert 1 suppository (25 mg  total) rectally every 12 (twelve) hours. (Patient not taking: Reported on 04/02/2022), Disp: 14 suppository, Rfl: 1   tacrolimus (PROTOPIC) 0.1 % ointment, Apply to affected area on ears daily as directed, Disp: 100 g, Rfl: 0  Observations/Objective: Patient is well-developed, well-nourished in no acute distress.  Resting comfortably  Head is normocephalic, atraumatic.  No labored breathing.  Speech is clear and coherent with logical content.  Patient is alert and oriented at baseline.    Assessment and Plan: 1. Other infective acute otitis externa of left ear - ciprofloxacin-dexamethasone (CIPRODEX) OTIC suspension; Place 4 drops into the left ear 2 (two) times daily. X 7 days  Dispense: 7.5 mL; Refill: 0  - Ciprodex prescribed - Warm compresses to external ear as needed - Tylenol or Ibuprofen as needed for pain - Seek in person evaluation if treatment fails or symptoms worsen  Follow Up Instructions: I discussed the assessment and treatment plan with the patient. The patient was provided an opportunity to ask questions and all were answered. The patient agreed with the plan and demonstrated an understanding of the instructions.  A copy of instructions were sent to the patient via MyChart unless otherwise noted below.    The patient was advised to call back or seek an in-person evaluation if the symptoms worsen or if the condition fails to improve as anticipated.  Time:  I spent 8 minutes with the patient via telehealth technology discussing the above problems/concerns.    Mar Daring, PA-C

## 2022-08-11 NOTE — Patient Instructions (Signed)
Suzanne Mclaughlin, thank you for joining Mar Daring, PA-C for today's virtual visit.  While this provider is not your primary care provider (PCP), if your PCP is located in our provider database this encounter information will be shared with them immediately following your visit.  Consent: (Patient) Suzanne Mclaughlin provided verbal consent for this virtual visit at the beginning of the encounter.  Current Medications:  Current Outpatient Medications:    ciprofloxacin-dexamethasone (CIPRODEX) OTIC suspension, Place 4 drops into the left ear 2 times daily for 7 days, Disp: 7.5 mL, Rfl: 0   hydrocortisone (ANUSOL-HC) 25 MG suppository, Unwrap and insert 1 suppository (25 mg total) rectally every 12 (twelve) hours. (Patient not taking: Reported on 04/02/2022), Disp: 14 suppository, Rfl: 1   tacrolimus (PROTOPIC) 0.1 % ointment, Apply to affected area on ears daily as directed, Disp: 100 g, Rfl: 0   Medications ordered in this encounter:  Meds ordered this encounter  Medications   ciprofloxacin-dexamethasone (CIPRODEX) OTIC suspension    Sig: Place 4 drops into the left ear 2 times daily for 7 days    Dispense:  7.5 mL    Refill:  0    Order Specific Question:   Supervising Provider    Answer:   Noemi Chapel [[id number]]     *If you need refills on other medications prior to your next appointment, please contact your pharmacy*  Follow-Up: Call back or seek an in-person evaluation if the symptoms worsen or if the condition fails to improve as anticipated.  Other Instructions Otitis Externa  Otitis externa is an infection of the outer ear canal. The outer ear canal is the area between the outside of the ear and the eardrum. Otitis externa is sometimes called swimmer's ear. What are the causes? Common causes of this condition include: Swimming in dirty water. Moisture in the ear. An injury to the inside of the ear. An object stuck in the ear. A cut or scrape on the outside of the ear or  in the ear canal. What increases the risk? You are more likely to develop this condition if you go swimming often. What are the signs or symptoms? The first symptom of this condition is often itching in the ear. Later symptoms of the condition include: Swelling of the ear. Redness in the ear. Ear pain. The pain may get worse when you pull on your ear. Pus coming from the ear. How is this diagnosed? This condition may be diagnosed by examining the ear and testing fluid from the ear for bacteria and funguses. How is this treated? This condition may be treated with: Antibiotic ear drops. These are often given for 10-14 days. Medicines to reduce itching and swelling. Follow these instructions at home: If you were prescribed antibiotic ear drops, use them as told by your health care provider. Do not stop using the antibiotic even if you start to feel better. Take over-the-counter and prescription medicines only as told by your health care provider. Avoid getting water in your ears as told by your health care provider. This may include avoiding swimming or water sports for a few days. Keep all follow-up visits. This is important. How is this prevented? Keep your ears dry. Use the corner of a towel to dry your ears after you swim or bathe. Avoid scratching or putting things in your ear. Doing these things can damage the ear canal or remove the protective wax that lines it, which makes it easier for bacteria and funguses to grow. Avoid swimming  in lakes, polluted water, or swimming pools that may not have enough chlorine. Contact a health care provider if: You have a fever. Your ear is still red, swollen, painful, or draining pus after 3 days. Your redness, swelling, or pain gets worse. You have a severe headache. Get help right away if: You have redness, swelling, and pain or tenderness in the area behind your ear. Summary Otitis externa is an infection of the outer ear canal. Common causes  include swimming in dirty water, moisture in the ear, or a cut or scrape in the ear. Symptoms include pain, redness, and swelling of the ear canal. If you were prescribed antibiotic ear drops, use them as told by your health care provider. Do not stop using the antibiotic even if you start to feel better. This information is not intended to replace advice given to you by your health care provider. Make sure you discuss any questions you have with your health care provider. Document Revised: 02/18/2021 Document Reviewed: 02/18/2021 Elsevier Patient Education  [place].    If you have been instructed to have an in-person evaluation today at a local Urgent Care facility, please use the link below. It will take you to a list of all of our available [place] [facility], including address, phone number and hours of operation. Please do not delay care.  [facility]  If you or a family member do not have a primary care provider, use the link below to schedule a visit and establish care. When you choose a [facility] primary care physician or advanced practice provider, you gain a long-term partner in health. Find a Primary Care Provider  Learn more about [facility]'s in-office and virtual care options: [facility]

## 2023-01-05 ENCOUNTER — Other Ambulatory Visit (HOSPITAL_COMMUNITY): Payer: Self-pay

## 2023-01-05 MED ORDER — AMOXICILLIN 500 MG PO CAPS
500.0000 mg | ORAL_CAPSULE | Freq: Three times a day (TID) | ORAL | 0 refills | Status: DC
  Filled 2023-01-05: qty 21, 7d supply, fill #0

## 2023-03-16 ENCOUNTER — Other Ambulatory Visit: Payer: Self-pay | Admitting: Nurse Practitioner

## 2023-03-16 DIAGNOSIS — Z1231 Encounter for screening mammogram for malignant neoplasm of breast: Secondary | ICD-10-CM

## 2023-04-07 ENCOUNTER — Encounter: Payer: Managed Care, Other (non HMO) | Admitting: Nurse Practitioner

## 2023-04-21 ENCOUNTER — Ambulatory Visit: Payer: Managed Care, Other (non HMO) | Admitting: Nurse Practitioner

## 2023-04-21 VITALS — BP 98/64 | HR 70 | Temp 98.9°F | Ht 65.0 in | Wt 129.2 lb

## 2023-04-21 DIAGNOSIS — Z13220 Encounter for screening for lipoid disorders: Secondary | ICD-10-CM | POA: Diagnosis not present

## 2023-04-21 DIAGNOSIS — Z136 Encounter for screening for cardiovascular disorders: Secondary | ICD-10-CM | POA: Diagnosis not present

## 2023-04-21 DIAGNOSIS — Z131 Encounter for screening for diabetes mellitus: Secondary | ICD-10-CM | POA: Diagnosis not present

## 2023-04-21 DIAGNOSIS — Z0001 Encounter for general adult medical examination with abnormal findings: Secondary | ICD-10-CM | POA: Diagnosis not present

## 2023-04-21 LAB — LIPID PANEL
Cholesterol: 165 mg/dL (ref 0–200)
HDL: 71.2 mg/dL (ref >39.00)
LDL Cholesterol: 86 mg/dL (ref 0–99)
NonHDL: 93.99
Total CHOL/HDL Ratio: 2
Triglycerides: 38 mg/dL (ref 0.0–149.0)
VLDL: 7.6 mg/dL (ref 0.0–40.0)

## 2023-04-21 LAB — CBC
HCT: 39.5 % (ref 36.0–46.0)
Hemoglobin: 13.3 g/dL (ref 12.0–15.0)
MCHC: 33.6 g/dL (ref 30.0–36.0)
MCV: 85.6 fl (ref 78.0–100.0)
Platelets: 256 10*3/uL (ref 150.0–400.0)
RBC: 4.62 Mil/uL (ref 3.87–5.11)
RDW: 12.9 % (ref 11.5–15.5)
WBC: 7.7 10*3/uL (ref 4.0–10.5)

## 2023-04-21 LAB — URINALYSIS WITH CULTURE, IF INDICATED
Bilirubin Urine: NEGATIVE
Hgb urine dipstick: NEGATIVE
Ketones, ur: NEGATIVE
Leukocytes,Ua: NEGATIVE
Nitrite: NEGATIVE
Specific Gravity, Urine: 1.02 (ref 1.000–1.030)
Total Protein, Urine: NEGATIVE
Urine Glucose: NEGATIVE
Urobilinogen, UA: 0.2 (ref 0.0–1.0)
pH: 6 (ref 5.0–8.0)

## 2023-04-21 LAB — COMPREHENSIVE METABOLIC PANEL
ALT: 10 U/L (ref 0–35)
AST: 16 U/L (ref 0–37)
Albumin: 4.5 g/dL (ref 3.5–5.2)
Alkaline Phosphatase: 37 U/L — ABNORMAL LOW (ref 39–117)
BUN: 14 mg/dL (ref 6–23)
CO2: 27 mEq/L (ref 19–32)
Calcium: 9.5 mg/dL (ref 8.4–10.5)
Chloride: 101 mEq/L (ref 96–112)
Creatinine, Ser: 0.75 mg/dL (ref 0.40–1.20)
GFR: 99.17 mL/min (ref >60.00)
Glucose, Bld: 84 mg/dL (ref 70–99)
Potassium: 4.1 mEq/L (ref 3.5–5.1)
Sodium: 137 mEq/L (ref 135–145)
Total Bilirubin: 0.6 mg/dL (ref 0.2–1.2)
Total Protein: 7.4 g/dL (ref 6.0–8.3)

## 2023-04-21 LAB — TSH: TSH: 2.26 u[IU]/mL (ref 0.35–5.50)

## 2023-04-21 LAB — HEMOGLOBIN A1C: Hgb A1c MFr Bld: 5.3 % (ref 4.6–6.5)

## 2023-04-21 NOTE — Assessment & Plan Note (Signed)
Labs ordered, further recommendations may be made based upon the results. 

## 2023-04-21 NOTE — Progress Notes (Signed)
Established Patient Office Visit  Subjective   Patient ID: Suzanne Mclaughlin, female    DOB: 1981-09-03  Age: 42 y.o. MRN: [id number]  Chief Complaint  Patient presents with   Annual Exam    Annual Exam: no acute concerns. Has an undiagnosed intermittent inflammatory condition of her ears. Seeing two dermatologists and actively undergoing testing.  Hep C screening: Due, Pap smear: 6/21, Tdap: 12/05/2018, HIV: Completed    Review of Systems  Constitutional:  Negative for chills, fever and weight loss.  HENT:  Negative for congestion, sinus pain and sore throat.   Eyes:  Negative for blurred vision and double vision.  Respiratory:  Negative for cough, shortness of breath and wheezing.   Cardiovascular:  Negative for chest pain and palpitations.  Gastrointestinal:  Positive for constipation (intermittent). Negative for abdominal pain and blood in stool.  Genitourinary:  Negative for dysuria and hematuria.  Musculoskeletal:  Negative for falls.  Skin:  Positive for rash (earlobes). Negative for itching.  Neurological:  Negative for dizziness, seizures, loss of consciousness and headaches.  Psychiatric/Behavioral:  Negative for depression and suicidal ideas. The patient is not nervous/anxious.       Objective:     BP 98/64   Pulse 70   Temp 98.9 F (37.2 C) (Temporal)   Ht 5\' 5"  (1.651 m)   Wt 129 lb 4 oz (58.6 kg)   LMP 04/11/2023   SpO2 99%   BMI 21.51 kg/m    Physical Exam Vitals reviewed.  Constitutional:      Appearance: Normal appearance.  HENT:     Head: Normocephalic and atraumatic.     Right Ear: Tympanic membrane, ear canal and external ear normal.     Left Ear: Tympanic membrane, ear canal and external ear normal.     Ears:   Eyes:     General:        Right eye: No discharge.        Left eye: No discharge.     Extraocular Movements: Extraocular movements intact.     Conjunctiva/sclera: Conjunctivae normal.     Pupils: Pupils are equal, round, and  reactive to light.  Neck:     Vascular: No carotid bruit.  Cardiovascular:     Rate and Rhythm: Normal rate and regular rhythm.     Pulses: Normal pulses.     Heart sounds: Normal heart sounds. No murmur heard. Pulmonary:     Effort: Pulmonary effort is normal.     Breath sounds: Normal breath sounds.  Chest:  Breasts:    Breasts are symmetrical.     Right: Normal.     Left: Normal.  Abdominal:     General: Abdomen is flat. Bowel sounds are normal. There is no distension.     Palpations: Abdomen is soft. There is no mass.     Tenderness: There is no abdominal tenderness.  Musculoskeletal:        General: No tenderness.     Cervical back: Neck supple. No muscular tenderness.     Right lower leg: No edema.     Left lower leg: No edema.  Lymphadenopathy:     Cervical: No cervical adenopathy.     Upper Body:     Right upper body: No supraclavicular adenopathy.     Left upper body: No supraclavicular adenopathy.  Skin:    General: Skin is warm and dry.  Neurological:     General: No focal deficit present.     Mental Status: She  is alert and oriented to person, place, and time.     Motor: No weakness.     Gait: Gait normal.  Psychiatric:        Mood and Affect: Mood normal.        Behavior: Behavior normal.        Judgment: Judgment normal.      No results found for any visits on 04/21/23.    The 10-year ASCVD risk score (Arnett DK, et al., 2019) is: 0.2%    Assessment & Plan:   Problem List Items Addressed This Visit       Other   Encounter for general adult medical examination with abnormal findings - Primary    Encouraged to continue living healthy lifestyle, handout provided. Check Hep C screening, patient to get PAP smear and mammogram when due.       Relevant Orders   Comprehensive metabolic panel   CBC   Hemoglobin A1c   Lipid panel   TSH   Hepatitis C antibody   Urinalysis with Culture, if indicated   Diabetes mellitus screening    Labs ordered,  further recommendations may be made based upon the results      Relevant Orders   Comprehensive metabolic panel   CBC   Hemoglobin A1c   Lipid panel   TSH   Hepatitis C antibody   Urinalysis with Culture, if indicated   Encounter for lipid screening for cardiovascular disease    Labs ordered, further recommendations may be made based upon the results      Relevant Orders   Comprehensive metabolic panel   CBC   Hemoglobin A1c   Lipid panel   TSH   Hepatitis C antibody   Urinalysis with Culture, if indicated    Return in about 1 year (around 04/20/2024) for CPE with Maralyn Sago.    Elenore Paddy, NP

## 2023-04-21 NOTE — Assessment & Plan Note (Signed)
Encouraged to continue living healthy lifestyle, handout provided. Check Hep C screening, patient to get PAP smear and mammogram when due.

## 2023-04-22 LAB — HEPATITIS C ANTIBODY: Hepatitis C Ab: NONREACTIVE

## 2023-05-06 ENCOUNTER — Ambulatory Visit: Payer: Managed Care, Other (non HMO)

## 2023-05-19 ENCOUNTER — Ambulatory Visit
Admission: RE | Admit: 2023-05-19 | Discharge: 2023-05-19 | Disposition: A | Discharge status: Home / Self Care | Payer: Managed Care, Other (non HMO) | Source: Ambulatory Visit | Attending: Nurse Practitioner | Admitting: Nurse Practitioner

## 2023-05-19 DIAGNOSIS — Z1231 Encounter for screening mammogram for malignant neoplasm of breast: Secondary | ICD-10-CM

## 2023-06-02 ENCOUNTER — Telehealth: Payer: Self-pay | Admitting: Nurse Practitioner

## 2023-06-02 NOTE — Telephone Encounter (Signed)
Please call patient and remind her that she is due for pap smear based on my records. If she has not already had this done she will need to schedule with her obgyn. If she has had this done we will need to request records from [facility]. I think she sees Dr. Mindi Slicker, but please verify this.

## 2023-06-09 NOTE — Telephone Encounter (Signed)
Mychart message send to pt to send [place] her OB/GYN information

## 2023-07-21 ENCOUNTER — Other Ambulatory Visit (HOSPITAL_COMMUNITY): Payer: Self-pay

## 2023-08-11 ENCOUNTER — Other Ambulatory Visit (HOSPITAL_COMMUNITY): Payer: Self-pay

## 2023-08-11 MED ORDER — FLUCONAZOLE 150 MG PO TABS
ORAL_TABLET | ORAL | 0 refills | Status: DC
  Filled 2023-08-11: qty 2, 3d supply, fill #0

## 2023-09-08 ENCOUNTER — Telehealth: Payer: Managed Care, Other (non HMO) | Admitting: Physician Assistant

## 2023-09-08 ENCOUNTER — Other Ambulatory Visit (HOSPITAL_COMMUNITY): Payer: Self-pay

## 2023-09-08 DIAGNOSIS — R3989 Other symptoms and signs involving the genitourinary system: Secondary | ICD-10-CM | POA: Diagnosis not present

## 2023-09-08 MED ORDER — NITROFURANTOIN MONOHYD MACRO 100 MG PO CAPS
100.0000 mg | ORAL_CAPSULE | Freq: Two times a day (BID) | ORAL | 0 refills | Status: DC
  Filled 2023-09-08: qty 10, 5d supply, fill #0

## 2023-09-08 NOTE — Patient Instructions (Signed)
Ambrose Mantle, thank you for joining Margaretann Loveless, PA-C for today's virtual visit.  While this provider is not your primary care provider (PCP), if your PCP is located in our provider database this encounter information will be shared with them immediately following your visit.   A [facility] MyChart account gives you access to today's visit and all your visits, tests, and labs performed at [facility] " click here if you don't have a [facility] MyChart account or go to [email or web address]  Consent: (Patient) Suzanne Mclaughlin provided verbal consent for this virtual visit at the beginning of the encounter.  Current Medications:  Current Outpatient Medications:    nitrofurantoin, macrocrystal-monohydrate, (MACROBID) 100 MG capsule, Take 1 capsule (100 mg total) by mouth 2 (two) times daily., Disp: 10 capsule, Rfl: 0   fluconazole (DIFLUCAN) 150 MG tablet, Take 1 tablet now, may repeat in 72 hours, Disp: 2 tablet, Rfl: 0   tacrolimus (PROTOPIC) 0.1 % ointment, Apply to affected area on ears daily as directed, Disp: 100 g, Rfl: 0   Medications ordered in this encounter:  Meds ordered this encounter  Medications   nitrofurantoin, macrocrystal-monohydrate, (MACROBID) 100 MG capsule    Sig: Take 1 capsule (100 mg total) by mouth 2 (two) times daily.    Dispense:  10 capsule    Refill:  0    Order Specific Question:   Supervising Provider    Answer:   Merrilee Jansky [id number]     *If you need refills on other medications prior to your next appointment, please contact your pharmacy*  Follow-Up: Call back or seek an in-person evaluation if the symptoms worsen or if the condition fails to improve as anticipated.  [facility] [phone]  Other Instructions Urinary Tract Infection, Adult  A urinary tract infection (UTI) is an infection of any part of the urinary tract. The urinary tract includes the kidneys, ureters, bladder, and urethra. These  organs make, store, and get rid of urine in the body. An upper UTI affects the ureters and kidneys. A lower UTI affects the bladder and urethra. What are the causes? Most urinary tract infections are caused by bacteria in your genital area around your urethra, where urine leaves your body. These bacteria grow and cause inflammation of your urinary tract. What increases the risk? You are more likely to develop this condition if: You have a urinary catheter that stays in place. You are not able to control when you urinate or have a bowel movement (incontinence). You are female and you: Use a spermicide or diaphragm for birth control. Have low estrogen levels. Are pregnant. You have certain genes that increase your risk. You are sexually active. You take antibiotic medicines. You have a condition that causes your flow of urine to slow down, such as: An enlarged prostate, if you are female. Blockage in your urethra. A kidney stone. A nerve condition that affects your bladder control (neurogenic bladder). Not getting enough to drink, or not urinating often. You have certain medical conditions, such as: Diabetes. A weak disease-fighting system (immunesystem). Sickle cell disease. Gout. Spinal cord injury. What are the signs or symptoms? Symptoms of this condition include: Needing to urinate right away (urgency). Frequent urination. This may include small amounts of urine each time you urinate. Pain or burning with urination. Blood in the urine. Urine that smells bad or unusual. Trouble urinating. Cloudy urine. Vaginal discharge, if you are female. Pain in the abdomen or the lower back. You may  also have: Vomiting or a decreased appetite. Confusion. Irritability or tiredness. A fever or chills. Diarrhea. The first symptom in older adults may be confusion. In some cases, they may not have any symptoms until the infection has worsened. How is this diagnosed? This condition is  diagnosed based on your medical history and a physical exam. You may also have other tests, including: Urine tests. Blood tests. Tests for STIs (sexually transmitted infections). If you have had more than one UTI, a cystoscopy or imaging studies may be done to determine the cause of the infections. How is this treated? Treatment for this condition includes: Antibiotic medicine. Over-the-counter medicines to treat discomfort. Drinking enough water to stay hydrated. If you have frequent infections or have other conditions such as a kidney stone, you may need to see a health care provider who specializes in the urinary tract (urologist). In rare cases, urinary tract infections can cause sepsis. Sepsis is a life-threatening condition that occurs when the body responds to an infection. Sepsis is treated in the hospital with IV antibiotics, fluids, and other medicines. Follow these instructions at home:  Medicines Take over-the-counter and prescription medicines only as told by your health care provider. If you were prescribed an antibiotic medicine, take it as told by your health care provider. Do not stop using the antibiotic even if you start to feel better. General instructions Make sure you: Empty your bladder often and completely. Do not hold urine for long periods of time. Empty your bladder after sex. Wipe from front to back after urinating or having a bowel movement if you are female. Use each tissue only one time when you wipe. Drink enough fluid to keep your urine pale yellow. Keep all follow-up visits. This is important. Contact a health care provider if: Your symptoms do not get better after 1-2 days. Your symptoms go away and then return. Get help right away if: You have severe pain in your back or your lower abdomen. You have a fever or chills. You have nausea or vomiting. Summary A urinary tract infection (UTI) is an infection of any part of the urinary tract, which includes  the kidneys, ureters, bladder, and urethra. Most urinary tract infections are caused by bacteria in your genital area. Treatment for this condition often includes antibiotic medicines. If you were prescribed an antibiotic medicine, take it as told by your health care provider. Do not stop using the antibiotic even if you start to feel better. Keep all follow-up visits. This is important. This information is not intended to replace advice given to you by your health care provider. Make sure you discuss any questions you have with your health care provider. Document Revised: 07/13/2020 Document Reviewed: 07/18/2020 Elsevier Patient Education  2024 Elsevier Inc.    If you have been instructed to have an in-person evaluation today at a local Urgent Care facility, please use the link below. It will take you to a list of all of our available [facility], including address, phone number and hours of operation. Please do not delay care.  [facility]  If you or a family member do not have a primary care provider, use the link below to schedule a visit and establish care. When you choose a [facility] primary care physician or advanced practice provider, you gain a long-term partner in health. Find a Primary Care Provider  Learn more about [facility] in-office and virtual care options:  - Get Care Now

## 2023-09-08 NOTE — Progress Notes (Signed)
Virtual Visit Consent   Suzanne Mclaughlin, you are scheduled for a virtual visit with a [facility] provider today. Just as with appointments in the office, your consent must be obtained to participate. Your consent will be active for this visit and any virtual visit you may have with one of our providers in the next 365 days. If you have a MyChart account, a copy of this consent can be sent to you electronically.  As this is a virtual visit, video technology does not allow for your provider to perform a traditional examination. This may limit your provider's ability to fully assess your condition. If your provider identifies any concerns that need to be evaluated in person or the need to arrange testing (such as labs, EKG, etc.), we will make arrangements to do so. Although advances in technology are sophisticated, we cannot ensure that it will always work on either your end or our end. If the connection with a video visit is poor, the visit may have to be switched to a telephone visit. With either a video or telephone visit, we are not always able to ensure that we have a secure connection.  By engaging in this virtual visit, you consent to the provision of healthcare and authorize for your insurance to be billed (if applicable) for the services provided during this visit. Depending on your insurance coverage, you may receive a charge related to this service.  I need to obtain your verbal consent now. Are you willing to proceed with your visit today? Shermaine Heaps has provided verbal consent on 09/08/2023 for a virtual visit (video or telephone). Margaretann Loveless, PA-C  Date: 09/08/2023 9:48 AM  Virtual Visit via Video Note   I, Margaretann Loveless, connected with  Suzanne Mclaughlin  ([id number], [birth year]) on 09/08/23 at  9:45 AM EDT by a video-enabled telemedicine application and verified that I am speaking with the correct person using two identifiers.  Location: Patient: Virtual Visit Location  Patient: Other: work, isolated Provider: Engineer, mining Provider: Home Office   I discussed the limitations of evaluation and management by telemedicine and the availability of in person appointments. The patient expressed understanding and agreed to proceed.    History of Present Illness: Suzanne Mclaughlin is a 42 y.o. who identifies as a female who was assigned female at birth, and is being seen today for possible UTI.  HPI: Urinary Tract Infection  This is a new problem. The current episode started in the past 7 days (2 days). The problem occurs every urination. The problem has been gradually worsening. The quality of the pain is described as burning and aching. There has been no fever. Associated symptoms include frequency, hesitancy and urgency. Pertinent negatives include no chills, discharge, flank pain, hematuria, nausea or vomiting. Associated symptoms comments: Feeling bladder fullness even after urinating. She has tried increased fluids for the symptoms. The treatment provided no relief.     Problems:  Patient Active Problem List   Diagnosis Date Noted   Diabetes mellitus screening 04/21/2023   Encounter for lipid screening for cardiovascular disease 04/21/2023   External hemorrhoid, bleeding 04/02/2022   Encounter for general adult medical examination with abnormal findings 04/02/2022   Benign neoplasm of skin of lower limb 01/01/2022   History of neoplasm 01/01/2022   Lentigo 01/01/2022   Melanocytic nevi of trunk 01/01/2022   Pain in axilla 03/17/2021   Pain in joint of right hip 12/08/2020   Pruritic disorder 02/21/2019   Personal history of malignant  melanoma of skin 12/02/2015   Malignant melanoma (HCC) 12/20/2014    Allergies:  Allergies  Allergen Reactions   Ivermectin Other (See Comments)   Medications:  Current Outpatient Medications:    fluconazole (DIFLUCAN) 150 MG tablet, Take 1 tablet now, may repeat in 72 hours, Disp: 2 tablet, Rfl: 0   tacrolimus  (PROTOPIC) 0.1 % ointment, Apply to affected area on ears daily as directed, Disp: 100 g, Rfl: 0  Observations/Objective: Patient is well-developed, well-nourished in no acute distress.  Resting comfortably at home.  Head is normocephalic, atraumatic.  No labored breathing.  Speech is clear and coherent with logical content.  Patient is alert and oriented at baseline.    Assessment and Plan: There are no diagnoses linked to this encounter. - Worsening symptoms.  - Will treat empirically with Macrobid - May use AZO for bladder spasms - Continue to push fluids.  - Seek in person evaluation for urine culture if symptoms do not improve or if they worsen.    Follow Up Instructions: I discussed the assessment and treatment plan with the patient. The patient was provided an opportunity to ask questions and all were answered. The patient agreed with the plan and demonstrated an understanding of the instructions.  A copy of instructions were sent to the patient via MyChart unless otherwise noted below.    The patient was advised to call back or seek an in-person evaluation if the symptoms worsen or if the condition fails to improve as anticipated.  Time:  I spent 8 minutes with the patient via telehealth technology discussing the above problems/concerns.    Margaretann Loveless, PA-C

## 2024-04-09 ENCOUNTER — Other Ambulatory Visit: Payer: Self-pay | Admitting: Obstetrics and Gynecology

## 2024-04-09 DIAGNOSIS — Z1231 Encounter for screening mammogram for malignant neoplasm of breast: Secondary | ICD-10-CM

## 2024-04-26 ENCOUNTER — Encounter: Payer: Managed Care, Other (non HMO) | Admitting: Nurse Practitioner

## 2024-04-27 ENCOUNTER — Other Ambulatory Visit: Payer: Self-pay | Admitting: Nurse Practitioner

## 2024-04-27 ENCOUNTER — Encounter: Payer: Self-pay | Admitting: Nurse Practitioner

## 2024-04-27 ENCOUNTER — Ambulatory Visit (INDEPENDENT_AMBULATORY_CARE_PROVIDER_SITE_OTHER): Payer: Managed Care, Other (non HMO) | Admitting: Nurse Practitioner

## 2024-04-27 VITALS — BP 98/76 | HR 68 | Temp 99.0°F | Ht 65.0 in | Wt 129.5 lb

## 2024-04-27 DIAGNOSIS — Z13220 Encounter for screening for lipoid disorders: Secondary | ICD-10-CM | POA: Diagnosis not present

## 2024-04-27 DIAGNOSIS — Z0001 Encounter for general adult medical examination with abnormal findings: Secondary | ICD-10-CM

## 2024-04-27 DIAGNOSIS — R7989 Other specified abnormal findings of blood chemistry: Secondary | ICD-10-CM

## 2024-04-27 DIAGNOSIS — Z131 Encounter for screening for diabetes mellitus: Secondary | ICD-10-CM

## 2024-04-27 DIAGNOSIS — Z136 Encounter for screening for cardiovascular disorders: Secondary | ICD-10-CM

## 2024-04-27 DIAGNOSIS — E785 Hyperlipidemia, unspecified: Secondary | ICD-10-CM

## 2024-04-27 DIAGNOSIS — Z0000 Encounter for general adult medical examination without abnormal findings: Secondary | ICD-10-CM | POA: Diagnosis not present

## 2024-04-27 LAB — COMPREHENSIVE METABOLIC PANEL WITH GFR
ALT: 10 U/L (ref 0–35)
AST: 15 U/L (ref 0–37)
Albumin: 4.5 g/dL (ref 3.5–5.2)
Alkaline Phosphatase: 34 U/L — ABNORMAL LOW (ref 39–117)
BUN: 15 mg/dL (ref 6–23)
CO2: 25 meq/L (ref 19–32)
Calcium: 9.4 mg/dL (ref 8.4–10.5)
Chloride: 104 meq/L (ref 96–112)
Creatinine, Ser: 0.78 mg/dL (ref 0.40–1.20)
GFR: 93.93 mL/min (ref >60.00)
Glucose, Bld: 90 mg/dL (ref 70–99)
Potassium: 4.3 meq/L (ref 3.5–5.1)
Sodium: 138 meq/L (ref 135–145)
Total Bilirubin: 0.6 mg/dL (ref 0.2–1.2)
Total Protein: 7.3 g/dL (ref 6.0–8.3)

## 2024-04-27 LAB — CBC
HCT: 38 % (ref 36.0–46.0)
Hemoglobin: 12.8 g/dL (ref 12.0–15.0)
MCHC: 33.8 g/dL (ref 30.0–36.0)
MCV: 85.8 fl (ref 78.0–100.0)
Platelets: 199 10*3/uL (ref 150.0–400.0)
RBC: 4.42 Mil/uL (ref 3.87–5.11)
RDW: 13 % (ref 11.5–15.5)
WBC: 5.8 10*3/uL (ref 4.0–10.5)

## 2024-04-27 LAB — HEMOGLOBIN A1C: Hgb A1c MFr Bld: 5.2 % (ref 4.6–6.5)

## 2024-04-27 LAB — LIPID PANEL
Cholesterol: 179 mg/dL (ref 0–200)
HDL: 66.7 mg/dL (ref >39.00)
LDL Cholesterol: 105 mg/dL — ABNORMAL HIGH (ref 0–99)
NonHDL: 111.87
Total CHOL/HDL Ratio: 3
Triglycerides: 35 mg/dL (ref 0.0–149.0)
VLDL: 7 mg/dL (ref 0.0–40.0)

## 2024-04-27 LAB — TSH: TSH: 5.53 u[IU]/mL — ABNORMAL HIGH (ref 0.35–5.50)

## 2024-04-27 NOTE — Assessment & Plan Note (Signed)
 Labs ordered, further recommendations may be made based upon his results Patient did have coffee with milk, but no food this morning.

## 2024-04-27 NOTE — Progress Notes (Signed)
 Complete physical exam  Patient: Suzanne Mclaughlin   DOB: [birth year]   43 y.o. Female  MRN: [id number]  Subjective:     No chief complaint on file.   Suzanne Mclaughlin is a 43 y.o. female who presents today for a complete physical exam. She mentions breast tenderness at times with her menstrual cycle. Has regular periods, has noticed that length of menstrual cycle has changed and has shortened. However she has not missed any periods and length or period itself remains the same. Has copper IUD for contraception. Follows with Dr. Arlyne Lame as her OBGYN regularly. Has mammogram scheduled for next month.  Up-to-date on routine vaccinations, has completed hep C screening. Most recent fall risk assessment:    04/21/2023    1:02 PM  Fall Risk   Falls in the past year? 0  Number falls in past yr: 0  Injury with Fall? 0  Risk for fall due to : No Fall Risks  Follow up Falls evaluation completed     Most recent depression screenings:    04/21/2023    1:02 PM 01/01/2022    8:58 AM  PHQ 2/9 Scores  PHQ - 2 Score 0 0    Past Medical History:  Diagnosis Date   Acne 12/02/2015   Cancer (HCC)    melanoma   GERD (gastroesophageal reflux disease)    Indication for care in labor and delivery, antepartum 12/02/2016   NSVD (normal spontaneous vaginal delivery) 02/24/2019   Postpartum care following vaginal delivery (12/15) 12/03/2016   Vaginal Pap smear, abnormal    Past Surgical History:  Procedure Laterality Date   BREAST BIOPSY Left 10/29/2021   BREAST BIOPSY Right 04/12/2018   LEG SURGERY Right 07/21/2015   1a melanoma removal from lower leg   NASAL SEPTUM SURGERY  12/20/2005   TONSILLECTOMY AND ADENOIDECTOMY  12/21/1995   Social History   Socioeconomic History   Marital status: Married    Spouse name: Not on file   Number of children: Not on file   Years of education: Not on file   Highest education level: Professional school degree (e.g., MD, DDS, DVM, JD)  Occupational History   Not on  file  Tobacco Use   Smoking status: Never   Smokeless tobacco: Never  Vaping Use   Vaping status: Never Used  Substance and Sexual Activity   Alcohol use: No    Alcohol/week: 0.0 standard drinks of alcohol   Drug use: No   Sexual activity: Yes  Other Topics Concern   Not on file  Social History Narrative   Not on file   Social Drivers of Health   Financial Resource Strain: Low Risk  (04/26/2024)   Overall Financial Resource Strain (CARDIA)    Difficulty of Paying Living Expenses: Not hard at all  Food Insecurity: No Food Insecurity (04/26/2024)   Hunger Vital Sign    Worried About Running Out of Food in the Last Year: Never true    Ran Out of Food in the Last Year: Never true  Transportation Needs: No Transportation Needs (04/26/2024)   PRAPARE - Administrator, Civil Service (Medical): No    Lack of Transportation (Non-Medical): No  Physical Activity: Insufficiently Active (04/26/2024)   Exercise Vital Sign    Days of Exercise per Week: 3 days    Minutes of Exercise per Session: 30 min  Stress: No Stress Concern Present (04/26/2024)   [employer] of Occupational Health - Occupational Stress Questionnaire    Feeling of  Stress : Only a little  Social Connections: Moderately Integrated (04/26/2024)   Social Connection and Isolation Panel [NHANES]    Frequency of Communication with Friends and Family: More than three times a week    Frequency of Social Gatherings with Friends and Family: Once a week    Attends Religious Services: 1 to 4 times per year    Active Member of [employer] or Organizations: No    Attends Engineer, structural: Not on file    Marital Status: Married  Intimate Partner Violence: Unknown (03/26/2022)   Received from [employer], [facility]   HITS    Physically Hurt: Not on file    Insult or Talk Down To: Not on file    Threaten Physical Harm: Not on file    Scream or Curse: Not on file   Family History  Problem Relation Age of Onset    Hyperlipidemia Mother    Hypertension Mother    Breast cancer Maternal Aunt 74   Cancer Maternal Aunt    Colon cancer Other        p. uncle   Stomach cancer Neg Hx    Allergies  Allergen Reactions   Ivermectin Other (See Comments)      Patient Care Team: Zorita Hiss, NP as PCP - General (Nurse Practitioner)   Outpatient Medications Prior to Visit  Medication Sig   [DISCONTINUED] fluconazole  (DIFLUCAN ) 150 MG tablet Take 1 tablet now, may repeat in 72 hours   [DISCONTINUED] nitrofurantoin , macrocrystal-monohydrate, (MACROBID ) 100 MG capsule Take 1 capsule (100 mg total) by mouth 2 (two) times daily.   [DISCONTINUED] tacrolimus  (PROTOPIC ) 0.1 % ointment Apply to affected area on ears daily as directed   No facility-administered medications prior to visit.    Review of Systems  All other systems reviewed and are negative.         Objective:     BP 98/76   Pulse 68   Temp 99 F (37.2 C) (Temporal)   Ht 5\' 5"  (1.651 m)   Wt 129 lb 8 oz (58.7 kg)   LMP 04/09/2024   SpO2 99%   BMI 21.55 kg/m  BP Readings from Last 3 Encounters:  04/27/24 98/76  04/21/23 98/64  04/02/22 126/70   Wt Readings from Last 3 Encounters:  04/27/24 129 lb 8 oz (58.7 kg)  04/21/23 129 lb 4 oz (58.6 kg)  04/02/22 131 lb (59.4 kg)      Physical Exam Vitals reviewed. Exam conducted with a chaperone present.  Constitutional:      Appearance: Normal appearance.  HENT:     Head: Normocephalic and atraumatic.     Right Ear: Tympanic membrane, ear canal and external ear normal.     Left Ear: Tympanic membrane, ear canal and external ear normal.  Eyes:     General:        Right eye: No discharge.        Left eye: No discharge.     Extraocular Movements: Extraocular movements intact.     Conjunctiva/sclera: Conjunctivae normal.     Pupils: Pupils are equal, round, and reactive to light.  Neck:     Vascular: No carotid bruit.  Cardiovascular:     Rate and Rhythm: Normal rate and  regular rhythm.     Pulses: Normal pulses.     Heart sounds: Normal heart sounds. No murmur heard. Pulmonary:     Effort: Pulmonary effort is normal.     Breath sounds: Normal breath sounds.  Chest:  Breasts:    Breasts are symmetrical.     Right: Normal.     Left: Normal.  Abdominal:     General: Abdomen is flat. Bowel sounds are normal. There is no distension.     Palpations: Abdomen is soft. There is no mass.     Tenderness: There is no abdominal tenderness.  Musculoskeletal:        General: No tenderness.     Cervical back: Neck supple. No muscular tenderness.     Right lower leg: No edema.     Left lower leg: No edema.  Lymphadenopathy:     Cervical: No cervical adenopathy.     Upper Body:     Right upper body: No supraclavicular adenopathy.     Left upper body: No supraclavicular adenopathy.  Skin:    General: Skin is warm and dry.  Neurological:     General: No focal deficit present.     Mental Status: She is alert and oriented to person, place, and time.     Motor: No weakness.     Gait: Gait normal.  Psychiatric:        Mood and Affect: Mood normal.        Behavior: Behavior normal.        Judgment: Judgment normal.      No results found for any visits on 04/27/24.     Assessment & Plan:    Routine Health Maintenance and Physical Exam  Immunization History  Administered Date(s) Administered   Influenza,inj,Quad PF,6+ Mos 09/07/2017, 09/06/2018   Influenza,inj,quad, With Preservative 09/19/2020   Influenza-Unspecified 09/06/2018   PFIZER Comirnaty(Lilianah Buffin Top)Covid-19 Tri-Sucrose Vaccine 01/09/2020, 01/28/2020, 09/27/2020, 09/26/2021   Tdap 12/02/2015, 12/21/2015, 12/05/2018    Health Maintenance  Topic Date Due   Cervical Cancer Screening (HPV/Pap Cotest)  04/01/2023   COVID-19 Vaccine (5 - 2024-25 season) 08/21/2023   INFLUENZA VACCINE  07/20/2024   DTaP/Tdap/Td (4 - Td or Tdap) 12/05/2028   Hepatitis C Screening  Completed   HIV Screening   Completed   HPV VACCINES  Aged Out   Meningococcal B Vaccine  Aged Out    Discussed health benefits of physical activity, and encouraged her to engage in regular exercise appropriate for her age and condition.  Problem List Items Addressed This Visit       Other   Encounter for general adult medical examination with abnormal findings - Primary   Discussed healthy lifestyle, discussed screening recommendations, handout provided.  Labs ordered, further recommendations may be made based upon these results       Relevant Orders   CBC   Comprehensive metabolic panel with GFR   Hemoglobin A1c   Lipid panel   TSH   Diabetes mellitus screening   Labs ordered, further recommendations may be made based upon his results       Relevant Orders   CBC   Comprehensive metabolic panel with GFR   Hemoglobin A1c   Lipid panel   TSH   Encounter for lipid screening for cardiovascular disease   Labs ordered, further recommendations may be made based upon his results Patient did have coffee with milk, but no food this morning.       Relevant Orders   CBC   Comprehensive metabolic panel with GFR   Hemoglobin A1c   Lipid panel   TSH   Return in about 1 year (around 04/27/2025) for CPE with Leslyn Monda.     Zorita Hiss, NP

## 2024-04-27 NOTE — Assessment & Plan Note (Signed)
 Labs ordered, further recommendations may be made based upon his results.

## 2024-04-27 NOTE — Assessment & Plan Note (Signed)
 Discussed healthy lifestyle, discussed screening recommendations, handout provided.  Labs ordered, further recommendations may be made based upon these results

## 2024-05-03 NOTE — Addendum Note (Signed)
 Addended by: Adella Agee E on: 05/03/2024 11:43 AM   Modules accepted: Orders

## 2024-05-17 ENCOUNTER — Ambulatory Visit: Payer: Self-pay | Admitting: Nurse Practitioner

## 2024-05-17 ENCOUNTER — Other Ambulatory Visit (INDEPENDENT_AMBULATORY_CARE_PROVIDER_SITE_OTHER)

## 2024-05-17 DIAGNOSIS — R7989 Other specified abnormal findings of blood chemistry: Secondary | ICD-10-CM

## 2024-05-17 LAB — T4, FREE: Free T4: 0.85 ng/dL (ref 0.60–1.60)

## 2024-05-17 LAB — TSH: TSH: 4.06 u[IU]/mL (ref 0.35–5.50)

## 2024-05-17 LAB — T3, FREE: T3, Free: 3 pg/mL (ref 2.3–4.2)

## 2024-05-21 ENCOUNTER — Ambulatory Visit

## 2024-05-21 ENCOUNTER — Ambulatory Visit
Admission: RE | Admit: 2024-05-21 | Discharge: 2024-05-21 | Disposition: A | Discharge status: Home / Self Care | Source: Ambulatory Visit | Attending: Obstetrics and Gynecology | Admitting: Obstetrics and Gynecology

## 2024-05-21 DIAGNOSIS — Z1231 Encounter for screening mammogram for malignant neoplasm of breast: Secondary | ICD-10-CM

## 2024-08-23 ENCOUNTER — Other Ambulatory Visit (HOSPITAL_COMMUNITY): Payer: Self-pay

## 2024-08-23 MED ORDER — PREDNISONE 20 MG PO TABS
ORAL_TABLET | ORAL | 0 refills | Status: AC
  Filled 2024-08-23: qty 21, 14d supply, fill #0

## 2024-09-19 ENCOUNTER — Other Ambulatory Visit (HOSPITAL_COMMUNITY): Payer: Self-pay

## 2024-09-19 MED ORDER — DOXYCYCLINE HYCLATE 20 MG PO TABS
20.0000 mg | ORAL_TABLET | Freq: Two times a day (BID) | ORAL | 1 refills | Status: AC
Start: 2024-09-19 — End: ?
  Filled 2024-09-19: qty 60, 30d supply, fill #0

## 2024-09-20 ENCOUNTER — Other Ambulatory Visit (HOSPITAL_COMMUNITY): Payer: Self-pay

## 2024-09-20 MED ORDER — DOXYCYCLINE HYCLATE 20 MG PO TABS
20.0000 mg | ORAL_TABLET | Freq: Two times a day (BID) | ORAL | 1 refills | Status: AC
  Filled 2024-09-20: qty 60, 30d supply, fill #0

## 2024-09-20 MED ORDER — FLUZONE 0.5 ML IM SUSY
0.5000 mL | PREFILLED_SYRINGE | Freq: Once | INTRAMUSCULAR | 0 refills | Status: AC
  Filled 2024-09-20: qty 0.5, 1d supply, fill #0

## 2024-11-05 ENCOUNTER — Other Ambulatory Visit (HOSPITAL_COMMUNITY): Payer: Self-pay

## 2024-11-05 MED ORDER — COVID-19 MRNA VAC-TRIS(PFIZER) 30 MCG/0.3ML IM SUSY
PREFILLED_SYRINGE | INTRAMUSCULAR | 0 refills | Status: AC
  Filled 2024-11-05: qty 0.3, 1d supply, fill #0

## 2025-01-03 ENCOUNTER — Ambulatory Visit: Admitting: Gastroenterology

## 2025-05-02 ENCOUNTER — Encounter: Admitting: Nurse Practitioner
